# Patient Record
Sex: Female | Born: 1965 | Race: White | Hispanic: No | Marital: Married | Smoking: Former smoker
Health system: Southern US, Community
[De-identification: ages and names within clinical notes are randomized; demographics above are authoritative.]

## PROBLEM LIST (undated history)

## (undated) DIAGNOSIS — Z9889 Other specified postprocedural states: Secondary | ICD-10-CM

## (undated) DIAGNOSIS — E785 Hyperlipidemia, unspecified: Secondary | ICD-10-CM

## (undated) DIAGNOSIS — T8859XA Other complications of anesthesia, initial encounter: Secondary | ICD-10-CM

## (undated) DIAGNOSIS — F419 Anxiety disorder, unspecified: Secondary | ICD-10-CM

## (undated) DIAGNOSIS — R519 Headache, unspecified: Secondary | ICD-10-CM

## (undated) DIAGNOSIS — M1990 Unspecified osteoarthritis, unspecified site: Secondary | ICD-10-CM

## (undated) DIAGNOSIS — R112 Nausea with vomiting, unspecified: Secondary | ICD-10-CM

## (undated) DIAGNOSIS — T4145XA Adverse effect of unspecified anesthetic, initial encounter: Secondary | ICD-10-CM

## (undated) DIAGNOSIS — K219 Gastro-esophageal reflux disease without esophagitis: Secondary | ICD-10-CM

## (undated) DIAGNOSIS — R51 Headache: Secondary | ICD-10-CM

## (undated) HISTORY — PX: APPENDECTOMY: SHX54

## (undated) HISTORY — PX: ABDOMINAL HYSTERECTOMY: SHX81

## (undated) HISTORY — PX: HAMMER TOE SURGERY: SHX385

## (undated) HISTORY — PX: BUNIONECTOMY: SHX129

---

## 2004-08-28 ENCOUNTER — Encounter: Payer: Self-pay | Admitting: Orthopaedic Surgery

## 2005-04-15 ENCOUNTER — Ambulatory Visit: Payer: Self-pay | Admitting: Family Medicine

## 2006-04-17 ENCOUNTER — Ambulatory Visit: Payer: Self-pay | Admitting: Family Medicine

## 2007-02-16 ENCOUNTER — Ambulatory Visit: Payer: Self-pay | Admitting: Emergency Medicine

## 2007-04-23 ENCOUNTER — Ambulatory Visit: Payer: Self-pay | Admitting: Family Medicine

## 2008-02-23 ENCOUNTER — Ambulatory Visit: Payer: Self-pay | Admitting: Urology

## 2008-03-01 ENCOUNTER — Ambulatory Visit: Payer: Self-pay | Admitting: Urology

## 2008-05-09 ENCOUNTER — Ambulatory Visit: Payer: Self-pay | Admitting: Family Medicine

## 2008-11-08 ENCOUNTER — Ambulatory Visit: Payer: Self-pay | Admitting: Internal Medicine

## 2009-05-18 ENCOUNTER — Ambulatory Visit: Payer: Self-pay | Admitting: Family Medicine

## 2009-12-14 ENCOUNTER — Emergency Department: Payer: Self-pay | Admitting: Unknown Physician Specialty

## 2010-01-05 ENCOUNTER — Ambulatory Visit: Payer: Self-pay | Admitting: Family Medicine

## 2010-05-29 ENCOUNTER — Ambulatory Visit: Payer: Self-pay | Admitting: Family Medicine

## 2010-05-30 ENCOUNTER — Ambulatory Visit: Payer: Self-pay | Admitting: Unknown Physician Specialty

## 2010-08-06 ENCOUNTER — Emergency Department: Payer: Self-pay | Admitting: Emergency Medicine

## 2010-10-22 ENCOUNTER — Ambulatory Visit: Payer: Self-pay | Admitting: Obstetrics and Gynecology

## 2010-10-29 ENCOUNTER — Ambulatory Visit: Payer: Self-pay | Admitting: Obstetrics and Gynecology

## 2010-11-01 LAB — PATHOLOGY REPORT

## 2011-06-26 ENCOUNTER — Ambulatory Visit: Payer: Self-pay | Admitting: Obstetrics and Gynecology

## 2012-01-19 ENCOUNTER — Ambulatory Visit: Payer: Self-pay | Admitting: Medical

## 2012-02-24 ENCOUNTER — Emergency Department: Payer: Self-pay | Admitting: Emergency Medicine

## 2012-04-28 ENCOUNTER — Ambulatory Visit: Payer: Self-pay | Admitting: Unknown Physician Specialty

## 2012-08-05 ENCOUNTER — Ambulatory Visit: Payer: Self-pay | Admitting: Podiatry

## 2013-02-11 ENCOUNTER — Ambulatory Visit: Payer: Self-pay | Admitting: Obstetrics and Gynecology

## 2013-12-31 ENCOUNTER — Ambulatory Visit: Payer: Self-pay | Admitting: Physician Assistant

## 2014-02-18 ENCOUNTER — Ambulatory Visit: Payer: Self-pay

## 2014-12-15 ENCOUNTER — Other Ambulatory Visit: Payer: Self-pay | Admitting: Obstetrics and Gynecology

## 2014-12-15 DIAGNOSIS — Z1231 Encounter for screening mammogram for malignant neoplasm of breast: Secondary | ICD-10-CM

## 2014-12-22 ENCOUNTER — Ambulatory Visit
Admission: RE | Admit: 2014-12-22 | Discharge: 2014-12-22 | Disposition: A | Discharge status: Home / Self Care | Payer: 59 | Source: Ambulatory Visit | Attending: Obstetrics and Gynecology | Admitting: Obstetrics and Gynecology

## 2014-12-22 DIAGNOSIS — Z1231 Encounter for screening mammogram for malignant neoplasm of breast: Secondary | ICD-10-CM

## 2015-01-20 ENCOUNTER — Other Ambulatory Visit: Payer: Self-pay | Admitting: Family Medicine

## 2015-01-20 DIAGNOSIS — R1011 Right upper quadrant pain: Secondary | ICD-10-CM

## 2015-01-25 ENCOUNTER — Ambulatory Visit
Admission: RE | Admit: 2015-01-25 | Discharge: 2015-01-25 | Disposition: A | Discharge status: Home / Self Care | Payer: 59 | Source: Ambulatory Visit | Attending: Family Medicine | Admitting: Family Medicine

## 2015-01-25 DIAGNOSIS — R1011 Right upper quadrant pain: Secondary | ICD-10-CM | POA: Insufficient documentation

## 2015-01-26 ENCOUNTER — Ambulatory Visit: Payer: 59

## 2015-01-27 ENCOUNTER — Emergency Department
Admission: EM | Admit: 2015-01-27 | Discharge: 2015-01-27 | Disposition: A | Discharge status: Home / Self Care | Payer: 59 | Attending: Emergency Medicine | Admitting: Emergency Medicine

## 2015-01-27 DIAGNOSIS — R1013 Epigastric pain: Secondary | ICD-10-CM | POA: Diagnosis present

## 2015-01-27 DIAGNOSIS — R197 Diarrhea, unspecified: Secondary | ICD-10-CM | POA: Insufficient documentation

## 2015-01-27 DIAGNOSIS — K219 Gastro-esophageal reflux disease without esophagitis: Secondary | ICD-10-CM | POA: Insufficient documentation

## 2015-01-27 DIAGNOSIS — Z87891 Personal history of nicotine dependence: Secondary | ICD-10-CM | POA: Diagnosis not present

## 2015-01-27 LAB — COMPREHENSIVE METABOLIC PANEL
ALT: 17 U/L (ref 14–54)
AST: 20 U/L (ref 15–41)
Albumin: 4.5 g/dL (ref 3.5–5.0)
Alkaline Phosphatase: 55 U/L (ref 38–126)
Anion gap: 8 (ref 5–15)
BUN: 14 mg/dL (ref 6–20)
CHLORIDE: 105 mmol/L (ref 101–111)
CO2: 27 mmol/L (ref 22–32)
CREATININE: 0.82 mg/dL (ref 0.44–1.00)
Calcium: 9.6 mg/dL (ref 8.9–10.3)
Glucose, Bld: 100 mg/dL — ABNORMAL HIGH (ref 65–99)
POTASSIUM: 4.3 mmol/L (ref 3.5–5.1)
SODIUM: 140 mmol/L (ref 135–145)
Total Bilirubin: 0.3 mg/dL (ref 0.3–1.2)
Total Protein: 7.7 g/dL (ref 6.5–8.1)

## 2015-01-27 LAB — URINALYSIS COMPLETE WITH MICROSCOPIC (ARMC ONLY)
Bilirubin Urine: NEGATIVE
Glucose, UA: NEGATIVE mg/dL
HGB URINE DIPSTICK: NEGATIVE
Ketones, ur: NEGATIVE mg/dL
LEUKOCYTES UA: NEGATIVE
Nitrite: NEGATIVE
PH: 5 (ref 5.0–8.0)
PROTEIN: NEGATIVE mg/dL
Specific Gravity, Urine: 1.018 (ref 1.005–1.030)

## 2015-01-27 LAB — CBC
HEMATOCRIT: 45 % (ref 35.0–47.0)
HEMOGLOBIN: 14.9 g/dL (ref 12.0–16.0)
MCH: 30.2 pg (ref 26.0–34.0)
MCHC: 33.2 g/dL (ref 32.0–36.0)
MCV: 90.8 fL (ref 80.0–100.0)
PLATELETS: 178 10*3/uL (ref 150–440)
RBC: 4.95 MIL/uL (ref 3.80–5.20)
RDW: 14 % (ref 11.5–14.5)
WBC: 5 10*3/uL (ref 3.6–11.0)

## 2015-01-27 LAB — LIPASE, BLOOD: LIPASE: 38 U/L (ref 11–51)

## 2015-01-27 MED ORDER — GI COCKTAIL ~~LOC~~
30.0000 mL | Freq: Once | ORAL | Status: AC
  Administered 2015-01-27: 30 mL via ORAL
  Filled 2015-01-27: qty 30

## 2015-01-27 MED ORDER — OXYCODONE-ACETAMINOPHEN 5-325 MG PO TABS: 2.0000 | ORAL_TABLET | Freq: Four times a day (QID) | ORAL | Status: DC | PRN

## 2015-01-27 MED ORDER — FAMOTIDINE 40 MG PO TABS: 40.0000 mg | ORAL_TABLET | Freq: Every evening | ORAL | Status: DC

## 2015-01-27 MED ORDER — SUCRALFATE 1 G PO TABS
1.0000 g | ORAL_TABLET | Freq: Once | ORAL | Status: AC
  Administered 2015-01-27: 1 g via ORAL
  Filled 2015-01-27 (×2): qty 1

## 2015-01-27 MED ORDER — FAMOTIDINE 20 MG PO TABS
20.0000 mg | ORAL_TABLET | Freq: Once | ORAL | Status: AC
  Administered 2015-01-27: 20 mg via ORAL
  Filled 2015-01-27: qty 1

## 2015-01-27 MED ORDER — SUCRALFATE 1 G PO TABS: 1.0000 g | ORAL_TABLET | Freq: Four times a day (QID) | ORAL | Status: DC

## 2015-01-27 NOTE — ED Notes (Signed)
Pt c/o upper abd pain with diarrhea for the past 2 weeks, states she has seen her PCP and had labs, stool and ultrasound to r/o gall bladder that has all came back negative..states she just feels bloated and swollen in the abd with continued pain and diarrhea

## 2015-01-27 NOTE — ED Provider Notes (Signed)
[facility] Emergency Department Provider Note     Time seen: ----------------------------------------- 3:08 PM on 01/27/2015 -----------------------------------------    I have reviewed the triage vital signs and the nursing notes.   HISTORY  Chief Complaint Abdominal Pain    HPI Alexa Garcia is a 49 y.o. female who presents ER for abdominal pain with diarrhea for the last 2 weeks. Pain is in the epigastric area, she seen her primary care doctor and had labs, stool and ultrasound done. Ultrasound of gallbladder was normal, she feels bloated and swollen continues to have pain and diarrhea. She is not taking omeprazole as prescribed, does note that she is under fair amount of stress due to her son medical problems.   History reviewed. No pertinent past medical history.  There are no active problems to display for this patient.   Past Surgical History  Procedure Laterality Date  . Abdominal hysterectomy    . Bunionectomy      Allergies Review of patient's allergies indicates no known allergies.  Social History Social History  Substance Use Topics  . Smoking status: Former Research scientist (life sciences)  . Smokeless tobacco: None  . Alcohol Use: No    Review of Systems Constitutional: Negative for fever. Eyes: Negative for visual changes. ENT: Negative for sore throat. Cardiovascular: Negative for chest pain. Respiratory: Negative for shortness of breath. Gastrointestinal: Positive for abdominal pain and diarrhea Genitourinary: Negative for dysuria. Musculoskeletal: Negative for back pain. Skin: Negative for rash. Neurological: Negative for headaches, focal weakness or numbness.  10-point ROS otherwise negative.  ____________________________________________   PHYSICAL EXAM:  VITAL SIGNS: ED Triage Vitals  Enc Vitals Group     BP 01/27/15 1345 135/91 mmHg     Pulse Rate 01/27/15 1345 80     Resp 01/27/15 1345 18     Temp 01/27/15 1345 97.5 F  (36.4 C)     Temp Source 01/27/15 1345 Oral     SpO2 01/27/15 1345 97 %     Weight 01/27/15 1345 187 lb (84.823 kg)     Height 01/27/15 1345 5\' 4"  (1.626 m)     Head Cir --      Peak Flow --      Pain Score 01/27/15 1346 9     Pain Loc --      Pain Edu? --      Excl. in [place]? --     Constitutional: Alert and oriented. Well appearing and in no distress. Eyes: Conjunctivae are normal. PERRL. Normal extraocular movements. ENT   Head: Normocephalic and atraumatic.   Nose: No congestion/rhinnorhea.   Mouth/Throat: Mucous membranes are moist.   Neck: No stridor. Cardiovascular: Normal rate, regular rhythm. Normal and symmetric distal pulses are present in all extremities. No murmurs, rubs, or gallops. Respiratory: Normal respiratory effort without tachypnea nor retractions. Breath sounds are clear and equal bilaterally. No wheezes/rales/rhonchi. Gastrointestinal: Epigastric tenderness, no rebound or guarding. Normal bowel sounds. Musculoskeletal: Nontender with normal range of motion in all extremities. No joint effusions.  No lower extremity tenderness nor edema. Neurologic:  Normal speech and language. No gross focal neurologic deficits are appreciated. Speech is normal. No gait instability. Skin:  Skin is warm, dry and intact. No rash noted. Psychiatric: Mood and affect are normal. Speech and behavior are normal. Patient exhibits appropriate insight and judgment. ____________________________________________  ED COURSE:  Pertinent labs & imaging results that were available during my care of the patient were reviewed by me and considered in my medical decision making (see  chart for details). She is in no acute distress, will obtain basic labs and reevaluate. Patient will likely need GI cocktail and antacids ____________________________________________    LABS (pertinent positives/negatives)  Labs Reviewed  COMPREHENSIVE METABOLIC PANEL - Abnormal; Notable for the  following:    Glucose, Bld 100 (*)    All other components within normal limits  URINALYSIS COMPLETEWITH MICROSCOPIC ([facility] ONLY) - Abnormal; Notable for the following:    Color, Urine YELLOW (*)    APPearance HAZY (*)    Bacteria, UA RARE (*)    Squamous Epithelial / LPF 6-30 (*)    All other components within normal limits  LIPASE, BLOOD  CBC   ____________________________________________  FINAL ASSESSMENT AND PLAN  GERD  Plan: Patient with labs and imaging as dictated above. Patient likely with either GERD or peptic ulcer disease. She'll be discharged with Pepcid, omeprazole, Carafate and referred to GI for follow-up.   Earleen Newport, MD   Earleen Newport, MD 01/27/15 [phone]

## 2015-01-27 NOTE — Discharge Instructions (Signed)
Gastroesophageal Reflux Disease, Adult Normally, food travels down the esophagus and stays in the stomach to be digested. However, when a person has gastroesophageal reflux disease (GERD), food and stomach acid move back up into the esophagus. When this happens, the esophagus becomes sore and inflamed. Over time, GERD can create small holes (ulcers) in the lining of the esophagus.  CAUSES This condition is caused by a problem with the muscle between the esophagus and the stomach (lower esophageal sphincter, or LES). Normally, the LES muscle closes after food passes through the esophagus to the stomach. When the LES is weakened or abnormal, it does not close properly, and that allows food and stomach acid to go back up into the esophagus. The LES can be weakened by certain dietary substances, medicines, and medical conditions, including:  Tobacco use.  Pregnancy.  Having a hiatal hernia.  Heavy alcohol use.  Certain foods and beverages, such as coffee, chocolate, onions, and peppermint. RISK FACTORS This condition is more likely to develop in:  People who have an increased body weight.  People who have connective tissue disorders.  People who use NSAID medicines. SYMPTOMS Symptoms of this condition include:  Heartburn.  Difficult or painful swallowing.  The feeling of having a lump in the throat.  Abitter taste in the mouth.  Bad breath.  Having a large amount of saliva.  Having an upset or bloated stomach.  Belching.  Chest pain.  Shortness of breath or wheezing.  Ongoing (chronic) cough or a night-time cough.  Wearing away of tooth enamel.  Weight loss. Different conditions can cause chest pain. Make sure to see your health care provider if you experience chest pain. DIAGNOSIS Your health care provider will take a medical history and perform a physical exam. To determine if you have mild or severe GERD, your health care provider may also monitor how you respond  to treatment. You may also have other tests, including:  An endoscopy toexamine your stomach and esophagus with a small camera.  A test thatmeasures the acidity level in your esophagus.  A test thatmeasures how much pressure is on your esophagus.  A barium swallow or modified barium swallow to show the shape, size, and functioning of your esophagus. TREATMENT The goal of treatment is to help relieve your symptoms and to prevent complications. Treatment for this condition may vary depending on how severe your symptoms are. Your health care provider may recommend:  Changes to your diet.  Medicine.  Surgery. HOME CARE INSTRUCTIONS Diet  Follow a diet as recommended by your health care provider. This may involve avoiding foods and drinks such as:  Coffee and tea (with or without caffeine).  Drinks that containalcohol.  Energy drinks and sports drinks.  Carbonated drinks or sodas.  Chocolate and cocoa.  Peppermint and mint flavorings.  Garlic and onions.  Horseradish.  Spicy and acidic foods, including peppers, chili powder, curry powder, vinegar, hot sauces, and barbecue sauce.  Citrus fruit juices and citrus fruits, such as oranges, lemons, and limes.  Tomato-based foods, such as red sauce, chili, salsa, and pizza with red sauce.  Fried and fatty foods, such as donuts, french fries, potato chips, and high-fat dressings.  High-fat meats, such as hot dogs and fatty cuts of red and white meats, such as rib eye steak, sausage, ham, and bacon.  High-fat dairy items, such as whole milk, butter, and cream cheese.  Eat small, frequent meals instead of large meals.  Avoid drinking large amounts of liquid with your  meals.  Avoid eating meals during the 2-3 hours before bedtime.  Avoid lying down right after you eat.  Do not exercise right after you eat. General Instructions  Pay attention to any changes in your symptoms.  Take over-the-counter and prescription  medicines only as told by your health care provider. Do not take aspirin, ibuprofen, or other NSAIDs unless your health care provider told you to do so.  Do not use any tobacco products, including cigarettes, chewing tobacco, and e-cigarettes. If you need help quitting, ask your health care provider.  Wear loose-fitting clothing. Do not wear anything tight around your waist that causes pressure on your abdomen.  Raise (elevate) the head of your bed 6 inches (15cm).  Try to reduce your stress, such as with yoga or meditation. If you need help reducing stress, ask your health care provider.  If you are overweight, reduce your weight to an amount that is healthy for you. Ask your health care provider for guidance about a safe weight loss goal.  Keep all follow-up visits as told by your health care provider. This is important. SEEK MEDICAL CARE IF:  You have new symptoms.  You have unexplained weight loss.  You have difficulty swallowing, or it hurts to swallow.  You have wheezing or a persistent cough.  Your symptoms do not improve with treatment.  You have a hoarse voice. SEEK IMMEDIATE MEDICAL CARE IF:  You have pain in your arms, neck, jaw, teeth, or back.  You feel sweaty, dizzy, or light-headed.  You have chest pain or shortness of breath.  You vomit and your vomit looks like blood or coffee grounds.  You faint.  Your stool is bloody or black.  You cannot swallow, drink, or eat.   This information is not intended to replace advice given to you by your health care provider. Make sure you discuss any questions you have with your health care provider.   Document Released: 12/05/2004 Document Revised: 11/16/2014 Document Reviewed: 06/22/2014 Elsevier Interactive Patient Education 2016 Elsevier Inc. Peptic Ulcer A peptic ulcer is a sore in the lining of your esophagus (esophageal ulcer), stomach (gastric ulcer), or in the first part of your small intestine (duodenal  ulcer). The ulcer causes erosion into the deeper tissue. CAUSES  Normally, the lining of the stomach and the small intestine protects itself from the acid that digests food. The protective lining can be damaged by:  An infection caused by a bacterium called Helicobacter pylori (H. pylori).  Regular use of nonsteroidal anti-inflammatory drugs (NSAIDs), such as ibuprofen or aspirin.  Smoking tobacco. Other risk factors include being older than 25, drinking alcohol excessively, and having a family history of ulcer disease.  SYMPTOMS   Burning pain or gnawing in the area between the chest and the belly button.  Heartburn.  Nausea and vomiting.  Bloating. The pain can be worse on an empty stomach and at night. If the ulcer results in bleeding, it can cause:  Black, tarry stools.  Vomiting of bright red blood.  Vomiting of coffee-ground-looking materials. DIAGNOSIS  A diagnosis is usually made based upon your history and an exam. Other tests and procedures may be performed to find the cause of the ulcer. Finding a cause will help determine the best treatment. Tests and procedures may include:  Blood tests, stool tests, or breath tests to check for the bacterium H. pylori.  An upper gastrointestinal (GI) series of the esophagus, stomach, and small intestine.  An endoscopy to examine the esophagus,  stomach, and small intestine.  A biopsy. TREATMENT  Treatment may include:  Eliminating the cause of the ulcer, such as smoking, NSAIDs, or alcohol.  Medicines to reduce the amount of acid in your digestive tract.  Antibiotic medicines if the ulcer is caused by the H. pylori bacterium.  An upper endoscopy to treat a bleeding ulcer.  Surgery if the bleeding is severe or if the ulcer created a hole somewhere in the digestive system. HOME CARE INSTRUCTIONS   Avoid tobacco, alcohol, and caffeine. Smoking can increase the acid in the stomach, and continued smoking will impair the  healing of ulcers.  Avoid foods and drinks that seem to cause discomfort or aggravate your ulcer.  Only take medicines as directed by your caregiver. Do not substitute over-the-counter medicines for prescription medicines without talking to your caregiver.  Keep any follow-up appointments and tests as directed. SEEK MEDICAL CARE IF:   Your do not improve within 7 days of starting treatment.  You have ongoing indigestion or heartburn. SEEK IMMEDIATE MEDICAL CARE IF:   You have sudden, sharp, or persistent abdominal pain.  You have bloody or dark black, tarry stools.  You vomit blood or vomit that looks like coffee grounds.  You become light-headed, weak, or feel faint.  You become sweaty or clammy. MAKE SURE YOU:   Understand these instructions.  Will watch your condition.  Will get help right away if you are not doing well or get worse.   This information is not intended to replace advice given to you by your health care provider. Make sure you discuss any questions you have with your health care provider.   Document Released: 02/23/2000 Document Revised: 03/18/2014 Document Reviewed: 09/25/2011 Elsevier Interactive Patient Education [employer].

## 2015-02-13 ENCOUNTER — Encounter: Payer: Self-pay | Admitting: *Deleted

## 2015-02-14 ENCOUNTER — Encounter
Admission: RE | Disposition: A | Discharge status: Home / Self Care | Payer: Self-pay | Source: Ambulatory Visit | Attending: Gastroenterology

## 2015-02-14 ENCOUNTER — Encounter: Payer: Self-pay | Admitting: Anesthesiology

## 2015-02-14 ENCOUNTER — Ambulatory Visit: Payer: 59 | Admitting: Anesthesiology

## 2015-02-14 ENCOUNTER — Ambulatory Visit
Admission: RE | Admit: 2015-02-14 | Discharge: 2015-02-14 | Disposition: A | Discharge status: Home / Self Care | Payer: 59 | Source: Ambulatory Visit | Attending: Gastroenterology | Admitting: Gastroenterology

## 2015-02-14 DIAGNOSIS — Z90710 Acquired absence of both cervix and uterus: Secondary | ICD-10-CM | POA: Insufficient documentation

## 2015-02-14 DIAGNOSIS — Z8249 Family history of ischemic heart disease and other diseases of the circulatory system: Secondary | ICD-10-CM | POA: Insufficient documentation

## 2015-02-14 DIAGNOSIS — Z823 Family history of stroke: Secondary | ICD-10-CM | POA: Diagnosis not present

## 2015-02-14 DIAGNOSIS — E785 Hyperlipidemia, unspecified: Secondary | ICD-10-CM | POA: Diagnosis not present

## 2015-02-14 DIAGNOSIS — Z87891 Personal history of nicotine dependence: Secondary | ICD-10-CM | POA: Diagnosis not present

## 2015-02-14 DIAGNOSIS — Z1211 Encounter for screening for malignant neoplasm of colon: Secondary | ICD-10-CM | POA: Diagnosis not present

## 2015-02-14 DIAGNOSIS — G43909 Migraine, unspecified, not intractable, without status migrainosus: Secondary | ICD-10-CM | POA: Insufficient documentation

## 2015-02-14 DIAGNOSIS — R51 Headache: Secondary | ICD-10-CM | POA: Diagnosis not present

## 2015-02-14 DIAGNOSIS — K219 Gastro-esophageal reflux disease without esophagitis: Secondary | ICD-10-CM | POA: Diagnosis not present

## 2015-02-14 DIAGNOSIS — Z809 Family history of malignant neoplasm, unspecified: Secondary | ICD-10-CM | POA: Diagnosis not present

## 2015-02-14 DIAGNOSIS — R1013 Epigastric pain: Secondary | ICD-10-CM | POA: Insufficient documentation

## 2015-02-14 DIAGNOSIS — D123 Benign neoplasm of transverse colon: Secondary | ICD-10-CM | POA: Diagnosis not present

## 2015-02-14 DIAGNOSIS — Z79899 Other long term (current) drug therapy: Secondary | ICD-10-CM | POA: Insufficient documentation

## 2015-02-14 DIAGNOSIS — Z833 Family history of diabetes mellitus: Secondary | ICD-10-CM | POA: Insufficient documentation

## 2015-02-14 DIAGNOSIS — K5730 Diverticulosis of large intestine without perforation or abscess without bleeding: Secondary | ICD-10-CM | POA: Diagnosis not present

## 2015-02-14 HISTORY — DX: Headache, unspecified: R51.9

## 2015-02-14 HISTORY — DX: Hyperlipidemia, unspecified: E78.5

## 2015-02-14 HISTORY — DX: Headache: R51

## 2015-02-14 HISTORY — PX: COLONOSCOPY: SHX5424

## 2015-02-14 HISTORY — DX: Gastro-esophageal reflux disease without esophagitis: K21.9

## 2015-02-14 SURGERY — COLONOSCOPY
Anesthesia: General

## 2015-02-14 MED ORDER — GLYCOPYRROLATE 0.2 MG/ML IJ SOLN
INTRAMUSCULAR | Status: DC | PRN
  Administered 2015-02-14 (×2): 0.2 mg via INTRAVENOUS

## 2015-02-14 MED ORDER — SODIUM CHLORIDE 0.9 % IV SOLN
INTRAVENOUS | Status: DC
  Administered 2015-02-14: 08:00:00 via INTRAVENOUS

## 2015-02-14 MED ORDER — MIDAZOLAM HCL 5 MG/5ML IJ SOLN
INTRAMUSCULAR | Status: DC | PRN
  Administered 2015-02-14 (×2): 1 mg via INTRAVENOUS

## 2015-02-14 MED ORDER — PROPOFOL 10 MG/ML IV BOLUS
INTRAVENOUS | Status: DC | PRN
  Administered 2015-02-14: 50 mg via INTRAVENOUS

## 2015-02-14 MED ORDER — FENTANYL CITRATE (PF) 100 MCG/2ML IJ SOLN
INTRAMUSCULAR | Status: DC | PRN
  Administered 2015-02-14 (×2): 50 ug via INTRAVENOUS

## 2015-02-14 MED ORDER — PROPOFOL 500 MG/50ML IV EMUL
INTRAVENOUS | Status: DC | PRN
  Administered 2015-02-14: 100 ug/kg/min via INTRAVENOUS

## 2015-02-14 MED ORDER — SODIUM CHLORIDE 0.9 % IV SOLN: INTRAVENOUS | Status: DC

## 2015-02-14 MED ORDER — LIDOCAINE HCL (PF) 2 % IJ SOLN
INTRAMUSCULAR | Status: DC | PRN
Start: 2015-02-14 — End: 2015-02-14
  Administered 2015-02-14: 50 mg

## 2015-02-14 NOTE — Transfer of Care (Signed)
Immediate Anesthesia Transfer of Care Note  Patient: Alexa Garcia  Procedure(s) Performed: Procedure(s): COLONOSCOPY (N/A)  Patient Location: PACU  Anesthesia Type:General  Level of Consciousness: sedated  Airway & Oxygen Therapy: Patient Spontanous Breathing and Patient connected to nasal cannula oxygen  Post-op Assessment: Report given to RN and Post -op Vital signs reviewed and stable  Post vital signs: Reviewed and stable  Last Vitals:  Filed Vitals:   02/14/15 0717  BP: 115/79  Pulse: 71  Temp: 36.4 C  Resp: 18    Complications: No apparent anesthesia complications

## 2015-02-14 NOTE — H&P (Signed)
Outpatient short stay form Pre-procedure 02/14/2015 8:09 AM Lollie Sails MD  Primary Physician: Dr. Laverta Baltimore  Reason for visit:  Screening colonoscopy  History of present illness:  Patient is a 49 year old female presenting today for colonoscopy. She's never had a colonoscopy before. She was seen by Mrs. London in the clinic she did have some dyspepsia or epigastric discomfort and was advised to start some omeprazole. She wants to hold off on doing an EGD at this point. Told to avoid NSAIDs and follow low residue/bland diet.  She tolerated her prep well. She takes no aspirin products or blood thinners.    Current facility-administered medications:  .  0.9 %  sodium chloride infusion, , Intravenous, Continuous, Lollie Sails, MD, Last Rate: 20 mL/hr at 02/14/15 [id number]  Prescriptions prior to admission  Medication Sig Dispense Refill Last Dose  . fluticasone (FLONASE) 50 MCG/ACT nasal spray Place into both nostrils daily.     Marland Kitchen ibuprofen (ADVIL,MOTRIN) 200 MG tablet Take 200 mg by mouth every 6 (six) hours as needed.     . Pediatric Multivit-Minerals-C (CHEWABLES MULTIVITAMIN PO) Take by mouth.   Past Week at Unknown time  . famotidine (PEPCID) 40 MG tablet Take 1 tablet (40 mg total) by mouth every evening. 30 tablet 1   . oxyCODONE-acetaminophen (PERCOCET) 5-325 MG tablet Take 2 tablets by mouth every 6 (six) hours as needed for moderate pain or severe pain. 20 tablet 0   . sucralfate (CARAFATE) 1 G tablet Take 1 tablet (1 g total) by mouth 4 (four) times daily. (Patient not taking: Reported on 02/14/2015) 120 tablet 1 Not Taking at Unknown time     No Known Allergies   Past Medical History  Diagnosis Date  . GERD (gastroesophageal reflux disease)   . Hyperlipidemia   . Headache     migranes    Review of systems:      Physical Exam    Heart and lungs: Regular rate and rhythm without rub or gallop lungs are bilaterally clear    HEENT: Normocephalic  atraumatic eyes are anicteric   Other:     Pertinant exam for procedure: Soft nontender nondistended bowel sounds positive normoactive    Planned proceedures: Colonoscopy and indicated procedures. I have discussed the risks benefits and complications of procedures to include not limited to bleeding, infection, perforation and the risk of sedation and the patient wishes to proceed.    Lollie Sails, MD Gastroenterology 02/14/2015  8:09 AM

## 2015-02-14 NOTE — Op Note (Signed)
[facility]astroenterology Patient Name: Alexa Garcia Procedure Date: 02/14/2015 8:09 AM MRN: [id number] Account #: [id number] Date of Birth: 1966-01-12 Admit Type: Outpatient Age: 49 Room: [facility] ENDO ROOM 3 Gender: Female Note Status: Finalized Procedure:         Colonoscopy Indications:       Screening for colorectal malignant neoplasm Providers:         Lollie Sails, MD Referring MD:      Shirline Frees (Referring MD) Medicines:         Monitored Anesthesia Care Complications:     No immediate complications. Procedure:         Pre-Anesthesia Assessment:                    - ASA Grade Assessment: II - A patient with mild systemic                     disease.                    After obtaining informed consent, the colonoscope was                     passed under direct vision. Throughout the procedure, the                     patient's blood pressure, pulse, and oxygen saturations                     were monitored continuously. The Colonoscope was                     introduced through the anus and advanced to the the cecum,                     identified by appendiceal orifice and ileocecal valve. The                     colonoscopy was performed without difficulty. The patient                     tolerated the procedure well. The quality of the bowel                     preparation was good. Findings:      A 1 mm polyp was found in the transverse colon. The polyp was flat. The       polyp was removed with a cold biopsy forceps. Resection and retrieval       were complete.      Multiple small-mouthed diverticula were found in the sigmoid colon and       in the descending colon.      The retroflexed view of the distal rectum and anal verge was normal and       showed no anal or rectal abnormalities.      The exam was otherwise without abnormality.      The digital rectal exam was normal. Impression:        - One 1 mm polyp in the transverse  colon. Resected and                     retrieved.                    - Diverticulosis in the sigmoid colon  and in the                     descending colon.                    - The distal rectum and anal verge are normal on                     retroflexion view.                    - The examination was otherwise normal. Recommendation:    - Discharge patient to home. Procedure Code(s): --- Professional ---                    [phone], Colonoscopy, flexible; with biopsy, single or                     multiple Diagnosis Code(s): --- Professional ---                    Z12.11, Encounter for screening for malignant neoplasm of                     colon                    D12.3, Benign neoplasm of transverse colon                    K57.30, Diverticulosis of large intestine without                     perforation or abscess without bleeding CPT copyright 2014 American Medical Association. All rights reserved. The codes documented in this report are preliminary and upon coder review may  be revised to meet current compliance requirements. Lollie Sails, MD 02/14/2015 8:48:34 AM This report has been signed electronically. Number of Addenda: 0 Note Initiated On: 02/14/2015 8:09 AM Scope Withdrawal Time: 0 hours 11 minutes 0 seconds  Total Procedure Duration: 0 hours 28 minutes 26 seconds       [facility]

## 2015-02-14 NOTE — Anesthesia Preprocedure Evaluation (Signed)
Anesthesia Evaluation  Patient identified by MRN, date of birth, ID band Patient awake    Reviewed: Allergy & Precautions, H&P , NPO status , Patient's Chart, lab work & pertinent test results  History of Anesthesia Complications Negative for: history of anesthetic complications  Airway Mallampati: II  TM Distance: >3 FB Neck ROM: full    Dental no notable dental hx. (+) Teeth Intact   Pulmonary neg shortness of breath, former smoker,    Pulmonary exam normal breath sounds clear to auscultation       Cardiovascular Exercise Tolerance: Good (-) angina(-) Past MI and (-) DOE negative cardio ROS Normal cardiovascular exam Rhythm:regular Rate:Normal     Neuro/Psych  Headaches, negative psych ROS   GI/Hepatic Neg liver ROS, GERD  Controlled,  Endo/Other  negative endocrine ROS  Renal/GU negative Renal ROS  negative genitourinary   Musculoskeletal   Abdominal   Peds  Hematology negative hematology ROS (+)   Anesthesia Other Findings Past Medical History:   GERD (gastroesophageal reflux disease)                       Hyperlipidemia                                               Headache                                                       Comment:migranes  Past Surgical History:   ABDOMINAL HYSTERECTOMY                                        BUNIONECTOMY                                                  APPENDECTOMY                                                  HAMMER TOE SURGERY                                           Signs and symptoms suggestive of sleep apnea     Reproductive/Obstetrics negative OB ROS                             Anesthesia Physical Anesthesia Plan  ASA: III  Anesthesia Plan: General   Post-op Pain Management:    Induction:   Airway Management Planned:   Additional Equipment:   Intra-op Plan:   Post-operative Plan:   Informed Consent: I have  reviewed the patients History and Physical, chart, labs and discussed the procedure including the risks, benefits and alternatives for the proposed  anesthesia with the patient or authorized representative who has indicated his/her understanding and acceptance.   Dental Advisory Given  Plan Discussed with: Anesthesiologist, CRNA and Surgeon  Anesthesia Plan Comments:         Anesthesia Quick Evaluation

## 2015-02-14 NOTE — Anesthesia Postprocedure Evaluation (Signed)
Anesthesia Post Note  Patient: Alexa Garcia  Procedure(s) Performed: Procedure(s) (LRB): COLONOSCOPY (N/A)  Patient location during evaluation: PACU Anesthesia Type: General Level of consciousness: awake and alert Pain management: pain level controlled Vital Signs Assessment: post-procedure vital signs reviewed and stable Respiratory status: spontaneous breathing, nonlabored ventilation, respiratory function stable and patient connected to nasal cannula oxygen Cardiovascular status: blood pressure returned to baseline and stable Postop Assessment: no signs of nausea or vomiting Anesthetic complications: no    Last Vitals:  Filed Vitals:   02/14/15 0910 02/14/15 0920  BP: 102/84 113/90  Pulse: 95 95  Temp:    Resp: 12 17    Last Pain: There were no vitals filed for this visit.               Molli Barrows

## 2015-02-15 ENCOUNTER — Encounter: Payer: Self-pay | Admitting: Gastroenterology

## 2015-02-15 LAB — SURGICAL PATHOLOGY

## 2015-03-08 ENCOUNTER — Encounter: Payer: Self-pay | Admitting: Emergency Medicine

## 2015-03-08 ENCOUNTER — Ambulatory Visit
Admission: EM | Admit: 2015-03-08 | Discharge: 2015-03-08 | Disposition: A | Discharge status: Home / Self Care | Payer: 59 | Attending: Family Medicine | Admitting: Family Medicine

## 2015-03-08 DIAGNOSIS — R0981 Nasal congestion: Secondary | ICD-10-CM

## 2015-03-08 NOTE — ED Provider Notes (Signed)
She presents today with symptoms of nasal congestion for the past two days. She has had postnasal drip as well. She denies any colored mucus. She denies any fever, chest pain, shortness of breath, severe headache. She has been taking Flonase for 1 day.  She denies any new possible allergens or new medications .   ROS: Negative except mentioned above.  Vitals as per Epic. GENERAL: NAD HEENT: mild pharyngeal erythema, no exudate, no erythema of TMs, mild to moderate erythema of nasal mucosa, no cervical LAD RESP: CTA B CARD: RRR NEURO: CN II-XII grossly intact   A/P: Nasal Congestion-patient given instructions to take antihistamine, decongestant, continue her Flonase. If symptoms persist or worsen as discussed patient may need an antibiotic. I have asked that she seek medical attention if symptoms do persist or worsen.   Paulina Fusi, MD 03/08/15 [phone]

## 2015-03-08 NOTE — ED Notes (Signed)
Pt reports increasing nasal congestion for past few days, facial pain. Took flonase. PMH allergies.

## 2016-03-28 ENCOUNTER — Emergency Department
Admission: EM | Admit: 2016-03-28 | Discharge: 2016-03-28 | Disposition: A | Discharge status: Home / Self Care | Payer: 59 | Attending: Emergency Medicine | Admitting: Emergency Medicine

## 2016-03-28 ENCOUNTER — Emergency Department: Payer: 59

## 2016-03-28 DIAGNOSIS — Y990 Civilian activity done for income or pay: Secondary | ICD-10-CM | POA: Insufficient documentation

## 2016-03-28 DIAGNOSIS — Y9389 Activity, other specified: Secondary | ICD-10-CM | POA: Insufficient documentation

## 2016-03-28 DIAGNOSIS — Y92481 Parking lot as the place of occurrence of the external cause: Secondary | ICD-10-CM | POA: Diagnosis not present

## 2016-03-28 DIAGNOSIS — W010XXA Fall on same level from slipping, tripping and stumbling without subsequent striking against object, initial encounter: Secondary | ICD-10-CM | POA: Diagnosis not present

## 2016-03-28 DIAGNOSIS — S52592A Other fractures of lower end of left radius, initial encounter for closed fracture: Secondary | ICD-10-CM | POA: Diagnosis not present

## 2016-03-28 DIAGNOSIS — W19XXXA Unspecified fall, initial encounter: Secondary | ICD-10-CM | POA: Diagnosis not present

## 2016-03-28 DIAGNOSIS — S52352A Displaced comminuted fracture of shaft of radius, left arm, initial encounter for closed fracture: Secondary | ICD-10-CM | POA: Diagnosis not present

## 2016-03-28 DIAGNOSIS — S52502A Unspecified fracture of the lower end of left radius, initial encounter for closed fracture: Secondary | ICD-10-CM

## 2016-03-28 DIAGNOSIS — Z87891 Personal history of nicotine dependence: Secondary | ICD-10-CM | POA: Insufficient documentation

## 2016-03-28 DIAGNOSIS — M79602 Pain in left arm: Secondary | ICD-10-CM | POA: Diagnosis not present

## 2016-03-28 DIAGNOSIS — S6992XA Unspecified injury of left wrist, hand and finger(s), initial encounter: Secondary | ICD-10-CM | POA: Diagnosis not present

## 2016-03-28 LAB — CBC WITH DIFFERENTIAL/PLATELET
BASOS PCT: 1 %
Basophils Absolute: 0.1 10*3/uL (ref 0–0.1)
EOS ABS: 0.2 10*3/uL (ref 0–0.7)
Eosinophils Relative: 3 %
HCT: 46.1 % (ref 35.0–47.0)
Hemoglobin: 15.6 g/dL (ref 12.0–16.0)
Lymphocytes Relative: 13 %
Lymphs Abs: 1 10*3/uL (ref 1.0–3.6)
MCH: 30 pg (ref 26.0–34.0)
MCHC: 33.9 g/dL (ref 32.0–36.0)
MCV: 88.4 fL (ref 80.0–100.0)
MONOS PCT: 8 %
Monocytes Absolute: 0.6 10*3/uL (ref 0.2–0.9)
NEUTROS PCT: 75 %
Neutro Abs: 5.6 10*3/uL (ref 1.4–6.5)
Platelets: 183 10*3/uL (ref 150–440)
RBC: 5.21 MIL/uL — ABNORMAL HIGH (ref 3.80–5.20)
RDW: 14.3 % (ref 11.5–14.5)
WBC: 7.5 10*3/uL (ref 3.6–11.0)

## 2016-03-28 LAB — BASIC METABOLIC PANEL
Anion gap: 7 (ref 5–15)
BUN: 22 mg/dL — ABNORMAL HIGH (ref 6–20)
CALCIUM: 9.9 mg/dL (ref 8.9–10.3)
CO2: 26 mmol/L (ref 22–32)
CREATININE: 0.89 mg/dL (ref 0.44–1.00)
Chloride: 106 mmol/L (ref 101–111)
GFR calc non Af Amer: >60 mL/min (ref >60)
Glucose, Bld: 119 mg/dL — ABNORMAL HIGH (ref 65–99)
Potassium: 4.9 mmol/L (ref 3.5–5.1)
SODIUM: 139 mmol/L (ref 135–145)

## 2016-03-28 MED ORDER — FENTANYL CITRATE (PF) 100 MCG/2ML IJ SOLN
50.0000 ug | Freq: Once | INTRAMUSCULAR | Status: AC
  Administered 2016-03-28: 50 ug via INTRAVENOUS
  Filled 2016-03-28: qty 2

## 2016-03-28 MED ORDER — HYDROCODONE-ACETAMINOPHEN 5-325 MG PO TABS: 1.0000 | ORAL_TABLET | Freq: Four times a day (QID) | ORAL | 0 refills | Status: DC | PRN

## 2016-03-28 NOTE — ED Notes (Signed)
Pt states she fell on ice this AM and is having left wrist pain, swelling noted, cap refill <3 secs, able to move fingers

## 2016-03-28 NOTE — ED Triage Notes (Signed)
Pt arrived via EMS from [facility]. Pt fell in parking lot reports left arm pain. No obvious deformities.

## 2016-03-28 NOTE — ED Provider Notes (Signed)
[facility] Emergency Department Provider Note  ____________________________________________  Time seen: Approximately 12:49 PM  I have reviewed the triage vital signs and the nursing notes.   HISTORY  Chief Complaint Fall and Arm Pain    HPI Alexa Garcia is a 51 y.o. female , NAD, presents to the emergency department via EMS for evaluation of left arm pain. Patient states she slipped and fell in the snow and ice while trying to pick up her vehicle at work. Place her left arm out to catch her fall. Had immediate pain about the distal left forearm and has noted swelling since that time. Has decreased range of motion of the left wrist, forearm due to pain. Denies any open wounds or lacerations. Denies numbness, weakness or tingling. States she did not hit her head and has had no neck or back pain since the incident.   Past Medical History:  Diagnosis Date  . GERD (gastroesophageal reflux disease)   . Headache    migranes  . Hyperlipidemia     There are no active problems to display for this patient.   Past Surgical History:  Procedure Laterality Date  . ABDOMINAL HYSTERECTOMY    . APPENDECTOMY    . BUNIONECTOMY    . COLONOSCOPY N/A 02/14/2015   Procedure: COLONOSCOPY;  Surgeon: Lollie Sails, MD;  [facility]: [facility] ENDOSCOPY;  Service: Endoscopy;  Laterality: N/A;  . HAMMER TOE SURGERY      Prior to Admission medications   Medication Sig Start Date End Date Taking? Authorizing Provider  famotidine (PEPCID) 40 MG tablet Take 1 tablet (40 mg total) by mouth every evening. 01/27/15 01/27/16  Earleen Newport, MD  fluticasone (FLONASE) 50 MCG/ACT nasal spray Place into both nostrils daily.    Historical Provider, MD  ibuprofen (ADVIL,MOTRIN) 200 MG tablet Take 200 mg by mouth every 6 (six) hours as needed.    Historical Provider, MD  oxyCODONE-acetaminophen (PERCOCET) 5-325 MG tablet Take 2 tablets by mouth every 6 (six) hours as needed for  moderate pain or severe pain. 01/27/15   Earleen Newport, MD  Pediatric Multivit-Minerals-C (CHEWABLES MULTIVITAMIN PO) Take by mouth.    Historical Provider, MD  sucralfate (CARAFATE) 1 G tablet Take 1 tablet (1 g total) by mouth 4 (four) times daily. Patient not taking: Reported on 02/14/2015 01/27/15 01/27/16  Earleen Newport, MD    Allergies Patient has no known allergies.  No family history on file.  Social History Social History  Substance Use Topics  . Smoking status: Former Research scientist (life sciences)  . Smokeless tobacco: Never Used  . Alcohol use No     Review of Systems  Constitutional: No fatigue Musculoskeletal: Positive left forearm pain. Negative for neck, back pain.  Skin: Positive swelling and bruising left forearm. Negative for rash. Neurological: Negative for numbness, weakness, tingling.  ____________________________________________   PHYSICAL EXAM:  VITAL SIGNS: ED Triage Vitals  Enc Vitals Group     BP 03/28/16 1210 125/60     Pulse Rate 03/28/16 1210 78     Resp 03/28/16 1210 18     Temp 03/28/16 1210 97.6 F (36.4 C)     Temp Source 03/28/16 1210 Oral     SpO2 03/28/16 1210 100 %     Weight 03/28/16 1211 190 lb (86.2 kg)     Height 03/28/16 1211 5\' 4"  (1.626 m)     Head Circumference --      Peak Flow --      Pain Score 03/28/16  1211 10     Pain Loc --      Pain Edu? --      Excl. in [place]? --      Constitutional: Alert and oriented. Well appearing and in no acute distress. Eyes: Conjunctivae are normal.  Head: Atraumatic. Cardiovascular:  Good peripheral circulationWith 2+ pulses noted in the left upper extremity. Capillary refill is brisk in all digits of left hand. Respiratory: Normal respiratory effort without tachypnea or retractions. Musculoskeletal: Severe tenderness to palpation about the distal left forearm with visible deformity noted. Patient is unable to pronate or supinate her forearm nor can she move the left wrist without severe pain.  Full range of motion of the left fingers without difficulty. Neurologic:  Normal speech and language. No gross focal neurologic deficits are appreciated. Facial light touch grossly intact but the left upper extremity. Skin:  Swelling and bruising noted about the distal left forearm. Skin is warm, dry and intact. No rash, redness, abnormal warmth, open wounds or lacerations noted. Psychiatric: Mood and affect are normal. Speech and behavior are normal. Patient exhibits appropriate insight and judgement.   ____________________________________________   LABS (all labs ordered are listed, but only abnormal results are displayed)  Labs Reviewed  BASIC METABOLIC PANEL - Abnormal; Notable for the following:       Result Value   Glucose, Bld 119 (*)    BUN 22 (*)    All other components within normal limits  CBC WITH DIFFERENTIAL/PLATELET - Abnormal; Notable for the following:    RBC 5.21 (*)    All other components within normal limits   ____________________________________________  EKG  None ____________________________________________  RADIOLOGY I, Jami L Hagler, personally viewed and evaluated these images (plain radiographs) as part of my medical decision making, as well as reviewing the written report by the radiologist.  Dg Forearm Left  Result Date: 03/28/2016 CLINICAL DATA:  [facility] Pt fell in parking lot reports left distal forearm pain; deformity noted; unable to supinate hand; did PA view and x table lateral EXAM: LEFT FOREARM - 2 VIEW COMPARISON:  11/08/2008 FINDINGS: There is a comminuted fracture of the distal left radius. An oblique fracture component extends from the radial aspect of the metadiaphysis distally to the distal metaphysis. A vertical fracture component extends to the articular surface between the lunate and scaphoid facets. There is no significant fracture displacement. There is volar angulation of the fracture of approximately 15 degrees.  There are no other fractures. The elbow and wrist joints are normally aligned. IMPRESSION: Comminuted fracture of the distal left radius with mild volar angulation as described. Electronically Signed   By: Lajean Manes M.D.   On: 03/28/2016 12:37    ____________________________________________    PROCEDURES  Procedure(s) performed: None   Procedures   Medications  fentaNYL (SUBLIMAZE) injection 50 mcg (50 mcg Intravenous Given 03/28/16 1304)     ____________________________________________   INITIAL IMPRESSION / ASSESSMENT AND PLAN / ED COURSE  Pertinent labs & imaging results that were available during my care of the patient were reviewed by me and considered in my medical decision making (see chart for details).  Clinical Course as of Mar 28 1325  Thu Mar 28, 2016  1315 I spoke with Dr. Rudene Christians, orthopedic physician on call. He suggests the patient be placed ina volar splint and follow up in his office Monday at 10am. She will receive a phone call from his office to confirm her appointment. It is anticipated the  patient will need surgery but the injury does not warrant admission and surgical intervention emergently.  [JH]    Clinical Course User Index [JH] Jami L Hagler, PA-C    Patient's diagnosis is consistent with Closed fracture of the distal end of the left radius. Patient was placed in a hole are splint and given an arm sling as suggested by Dr. Rudene Christians. Patient will be discharged home with prescriptions for Norco to take as directed. Patient understands that she is to ice the affected area 20 minutes 3-4 times daily and keep it elevated at all times. Patient is to follow up with Dr. Rudene Christians on Monday at [facility] for further evaluation and treatment of fracture. Patient was given a work note that she would remain out of work until seen by orthopedics. Patient is given ED precautions to return to the ED for any worsening or new symptoms.     ____________________________________________  FINAL CLINICAL IMPRESSION(S) / ED DIAGNOSES  Final diagnoses:  Closed fracture of distal end of left radius, unspecified fracture morphology, initial encounter      NEW MEDICATIONS STARTED DURING THIS VISIT:  New Prescriptions   No medications on file         Braxton Feathers, PA-C 03/28/16 [place], MD 03/29/16 [phone]

## 2016-04-01 ENCOUNTER — Encounter
Admission: RE | Admit: 2016-04-01 | Discharge: 2016-04-01 | Disposition: A | Discharge status: Home / Self Care | Payer: 59 | Source: Ambulatory Visit | Attending: Orthopedic Surgery | Admitting: Orthopedic Surgery

## 2016-04-01 DIAGNOSIS — S52572A Other intraarticular fracture of lower end of left radius, initial encounter for closed fracture: Secondary | ICD-10-CM | POA: Diagnosis not present

## 2016-04-01 HISTORY — DX: Adverse effect of unspecified anesthetic, initial encounter: T41.45XA

## 2016-04-01 HISTORY — DX: Unspecified osteoarthritis, unspecified site: M19.90

## 2016-04-01 HISTORY — DX: Other complications of anesthesia, initial encounter: T88.59XA

## 2016-04-01 HISTORY — DX: Anxiety disorder, unspecified: F41.9

## 2016-04-01 NOTE — Patient Instructions (Signed)
  Your procedure is scheduled on: 04-04-16 Report to Same Day Surgery 2nd floor medical mall [facility] Entrance-take elevator on left to 2nd floor.  Check in with surgery information desk.) To find out your arrival time please call [phone] between 1PM - 3PM on 04-03-16  Remember: Instructions that are not followed completely may result in serious medical risk, up to and including death, or upon the discretion of your surgeon and anesthesiologist your surgery may need to be rescheduled.    _x___ 1. Do not eat food or drink liquids after midnight. No gum chewing or hard candies.     __x__ 2. No Alcohol for 24 hours before or after surgery.   __x__3. No Smoking for 24 prior to surgery.   ____  4. Bring all medications with you on the day of surgery if instructed.    __x__ 5. Notify your doctor if there is any change in your medical condition     (cold, fever, infections).     Do not wear jewelry, make-up, hairpins, clips or nail polish.  Do not wear lotions, powders, or perfumes. You may wear deodorant.  Do not shave 48 hours prior to surgery. Men may shave face and neck.  Do not bring valuables to the hospital.    [facility] is not responsible for any belongings or valuables.               Contacts, dentures or bridgework may not be worn into surgery.  Leave your suitcase in the car. After surgery it may be brought to your room.  For patients admitted to the hospital, discharge time is determined by your treatment team.   Patients discharged the day of surgery will not be allowed to drive home.  You will need someone to drive you home and stay with you the night of your procedure.    Please read over the following fact sheets that you were given:   [facility] Preparing for Surgery and or MRSA Information   ____ Take these medicines the morning of surgery with A SIP OF WATER:    1. NONE  2.  3.  4.  5.  6.  ____Fleets enema or Magnesium Citrate as directed.   ____  Use CHG Soap or sage wipes as directed on instruction sheet   ____ Use inhalers on the day of surgery and bring to hospital day of surgery  ____ Stop metformin 2 days prior to surgery    ____ Take 1/2 of usual insulin dose the night before surgery and none on the morning of   surgery.   ____ Stop Aspirin, Coumadin, Pllavix ,Eliquis, Effient, or Pradaxa  x__ Stop Anti-inflammatories such as Advil, Aleve, Ibuprofen, Motrin, Naproxen,          Naprosyn, Goodies powders or aspirin products. Ok to take Tylenol OR HYDROCODONE IF NEEDED   ____ Stop supplements until after surgery.    ____ Bring C-Pap to the hospital.

## 2016-04-02 ENCOUNTER — Inpatient Hospital Stay: Admission: RE | Admit: 2016-04-02 | Payer: 59 | Source: Ambulatory Visit

## 2016-04-03 MED ORDER — CEFAZOLIN SODIUM-DEXTROSE 2-4 GM/100ML-% IV SOLN
2.0000 g | Freq: Once | INTRAVENOUS | Status: AC
  Administered 2016-04-04: 2 g via INTRAVENOUS

## 2016-04-04 ENCOUNTER — Encounter: Payer: Self-pay | Admitting: *Deleted

## 2016-04-04 ENCOUNTER — Ambulatory Visit: Payer: Commercial Managed Care - HMO | Admitting: Anesthesiology

## 2016-04-04 ENCOUNTER — Ambulatory Visit
Admission: RE | Admit: 2016-04-04 | Discharge: 2016-04-04 | Disposition: A | Discharge status: Home / Self Care | Payer: Commercial Managed Care - HMO | Source: Ambulatory Visit | Attending: Orthopedic Surgery | Admitting: Orthopedic Surgery

## 2016-04-04 ENCOUNTER — Ambulatory Visit: Payer: Commercial Managed Care - HMO

## 2016-04-04 ENCOUNTER — Encounter
Admission: RE | Disposition: A | Discharge status: Home / Self Care | Payer: Self-pay | Source: Ambulatory Visit | Attending: Orthopedic Surgery

## 2016-04-04 DIAGNOSIS — Z791 Long term (current) use of non-steroidal anti-inflammatories (NSAID): Secondary | ICD-10-CM | POA: Diagnosis not present

## 2016-04-04 DIAGNOSIS — M1990 Unspecified osteoarthritis, unspecified site: Secondary | ICD-10-CM | POA: Diagnosis not present

## 2016-04-04 DIAGNOSIS — Z79899 Other long term (current) drug therapy: Secondary | ICD-10-CM | POA: Diagnosis not present

## 2016-04-04 DIAGNOSIS — K219 Gastro-esophageal reflux disease without esophagitis: Secondary | ICD-10-CM | POA: Diagnosis not present

## 2016-04-04 DIAGNOSIS — E785 Hyperlipidemia, unspecified: Secondary | ICD-10-CM | POA: Insufficient documentation

## 2016-04-04 DIAGNOSIS — W000XXA Fall on same level due to ice and snow, initial encounter: Secondary | ICD-10-CM | POA: Insufficient documentation

## 2016-04-04 DIAGNOSIS — S52202A Unspecified fracture of shaft of left ulna, initial encounter for closed fracture: Secondary | ICD-10-CM | POA: Diagnosis not present

## 2016-04-04 DIAGNOSIS — S52572A Other intraarticular fracture of lower end of left radius, initial encounter for closed fracture: Secondary | ICD-10-CM | POA: Diagnosis not present

## 2016-04-04 DIAGNOSIS — Z87891 Personal history of nicotine dependence: Secondary | ICD-10-CM | POA: Diagnosis not present

## 2016-04-04 DIAGNOSIS — F419 Anxiety disorder, unspecified: Secondary | ICD-10-CM | POA: Diagnosis not present

## 2016-04-04 DIAGNOSIS — S63015A Dislocation of distal radioulnar joint of left wrist, initial encounter: Secondary | ICD-10-CM | POA: Insufficient documentation

## 2016-04-04 DIAGNOSIS — Z8781 Personal history of (healed) traumatic fracture: Secondary | ICD-10-CM

## 2016-04-04 DIAGNOSIS — Z9889 Other specified postprocedural states: Secondary | ICD-10-CM

## 2016-04-04 HISTORY — PX: OPEN REDUCTION INTERNAL FIXATION (ORIF) DISTAL RADIAL FRACTURE: SHX5989

## 2016-04-04 SURGERY — OPEN REDUCTION INTERNAL FIXATION (ORIF) DISTAL RADIUS FRACTURE
Anesthesia: General | Site: Wrist | Laterality: Left | Wound class: Clean

## 2016-04-04 MED ORDER — MIDAZOLAM HCL 2 MG/2ML IJ SOLN
INTRAMUSCULAR | Status: DC | PRN
  Administered 2016-04-04: 2 mg via INTRAVENOUS

## 2016-04-04 MED ORDER — PROMETHAZINE HCL 25 MG/ML IJ SOLN: 6.2500 mg | INTRAMUSCULAR | Status: DC | PRN

## 2016-04-04 MED ORDER — SODIUM CHLORIDE 0.9 % IV SOLN: INTRAVENOUS | Status: DC

## 2016-04-04 MED ORDER — FAMOTIDINE 20 MG PO TABS
20.0000 mg | ORAL_TABLET | Freq: Once | ORAL | Status: AC
  Administered 2016-04-04: 20 mg via ORAL

## 2016-04-04 MED ORDER — DEXAMETHASONE SODIUM PHOSPHATE 10 MG/ML IJ SOLN
INTRAMUSCULAR | Status: DC | PRN
  Administered 2016-04-04: 10 mg via INTRAVENOUS

## 2016-04-04 MED ORDER — NEOMYCIN-POLYMYXIN B GU 40-200000 IR SOLN
Status: AC
  Filled 2016-04-04: qty 2

## 2016-04-04 MED ORDER — DEXAMETHASONE SODIUM PHOSPHATE 10 MG/ML IJ SOLN
INTRAMUSCULAR | Status: AC
Start: 2016-04-04 — End: 2016-04-04
  Filled 2016-04-04: qty 1

## 2016-04-04 MED ORDER — PROPOFOL 10 MG/ML IV BOLUS
INTRAVENOUS | Status: AC
  Filled 2016-04-04: qty 20

## 2016-04-04 MED ORDER — FENTANYL CITRATE (PF) 100 MCG/2ML IJ SOLN
INTRAMUSCULAR | Status: AC
  Filled 2016-04-04: qty 2

## 2016-04-04 MED ORDER — PROPOFOL 10 MG/ML IV BOLUS
INTRAVENOUS | Status: DC | PRN
  Administered 2016-04-04: 160 mg via INTRAVENOUS

## 2016-04-04 MED ORDER — FAMOTIDINE 20 MG PO TABS
ORAL_TABLET | ORAL | Status: AC
  Filled 2016-04-04: qty 1

## 2016-04-04 MED ORDER — METOCLOPRAMIDE HCL 5 MG/ML IJ SOLN: 5.0000 mg | Freq: Three times a day (TID) | INTRAMUSCULAR | Status: DC | PRN

## 2016-04-04 MED ORDER — ONDANSETRON HCL 4 MG/2ML IJ SOLN
INTRAMUSCULAR | Status: AC
  Administered 2016-04-04: 4 mg via INTRAVENOUS
  Filled 2016-04-04: qty 2

## 2016-04-04 MED ORDER — HYDROCODONE-ACETAMINOPHEN 5-325 MG PO TABS: 1.0000 | ORAL_TABLET | ORAL | Status: DC | PRN

## 2016-04-04 MED ORDER — LIDOCAINE HCL (PF) 2 % IJ SOLN
INTRAMUSCULAR | Status: AC
  Filled 2016-04-04: qty 2

## 2016-04-04 MED ORDER — MIDAZOLAM HCL 2 MG/2ML IJ SOLN
INTRAMUSCULAR | Status: AC
  Filled 2016-04-04: qty 2

## 2016-04-04 MED ORDER — LACTATED RINGERS IV SOLN
INTRAVENOUS | Status: DC
  Administered 2016-04-04 (×2): via INTRAVENOUS

## 2016-04-04 MED ORDER — SCOPOLAMINE 1 MG/3DAYS TD PT72
1.0000 | MEDICATED_PATCH | TRANSDERMAL | Status: DC
  Administered 2016-04-04: 1.5 mg via TRANSDERMAL

## 2016-04-04 MED ORDER — FENTANYL CITRATE (PF) 100 MCG/2ML IJ SOLN
INTRAMUSCULAR | Status: DC | PRN
  Administered 2016-04-04: 50 ug via INTRAVENOUS
  Administered 2016-04-04: 25 ug via INTRAVENOUS

## 2016-04-04 MED ORDER — NEOMYCIN-POLYMYXIN B GU 40-200000 IR SOLN
Status: DC | PRN
  Administered 2016-04-04: 2 mL

## 2016-04-04 MED ORDER — HYDROMORPHONE HCL 1 MG/ML IJ SOLN
0.5000 mg | INTRAMUSCULAR | Status: DC | PRN
  Administered 2016-04-04 (×3): 0.5 mg via INTRAVENOUS

## 2016-04-04 MED ORDER — ONDANSETRON HCL 4 MG PO TABS: 4.0000 mg | ORAL_TABLET | Freq: Four times a day (QID) | ORAL | Status: DC | PRN

## 2016-04-04 MED ORDER — HYDROMORPHONE HCL 1 MG/ML IJ SOLN
INTRAMUSCULAR | Status: AC
Start: 2016-04-04 — End: 2016-04-04
  Administered 2016-04-04: 0.5 mg via INTRAVENOUS
  Filled 2016-04-04: qty 1

## 2016-04-04 MED ORDER — FENTANYL CITRATE (PF) 100 MCG/2ML IJ SOLN
INTRAMUSCULAR | Status: AC
  Administered 2016-04-04: 50 ug via INTRAVENOUS
  Filled 2016-04-04: qty 2

## 2016-04-04 MED ORDER — HYDROMORPHONE HCL 1 MG/ML IJ SOLN
INTRAMUSCULAR | Status: AC
  Administered 2016-04-04: 0.5 mg via INTRAVENOUS
  Filled 2016-04-04: qty 1

## 2016-04-04 MED ORDER — ONDANSETRON HCL 4 MG/2ML IJ SOLN
INTRAMUSCULAR | Status: DC | PRN
  Administered 2016-04-04: 4 mg via INTRAVENOUS

## 2016-04-04 MED ORDER — ONDANSETRON HCL 4 MG/2ML IJ SOLN
INTRAMUSCULAR | Status: AC
  Filled 2016-04-04: qty 2

## 2016-04-04 MED ORDER — CEFAZOLIN SODIUM-DEXTROSE 2-4 GM/100ML-% IV SOLN
INTRAVENOUS | Status: AC
  Filled 2016-04-04: qty 100

## 2016-04-04 MED ORDER — FENTANYL CITRATE (PF) 100 MCG/2ML IJ SOLN
25.0000 ug | INTRAMUSCULAR | Status: DC | PRN
  Administered 2016-04-04 (×3): 50 ug via INTRAVENOUS

## 2016-04-04 MED ORDER — METOCLOPRAMIDE HCL 10 MG PO TABS: 5.0000 mg | ORAL_TABLET | Freq: Three times a day (TID) | ORAL | Status: DC | PRN

## 2016-04-04 MED ORDER — SCOPOLAMINE 1 MG/3DAYS TD PT72
MEDICATED_PATCH | TRANSDERMAL | Status: AC
  Filled 2016-04-04: qty 1

## 2016-04-04 MED ORDER — ONDANSETRON HCL 4 MG/2ML IJ SOLN
4.0000 mg | Freq: Four times a day (QID) | INTRAMUSCULAR | Status: DC | PRN
  Administered 2016-04-04: 4 mg via INTRAVENOUS

## 2016-04-04 SURGICAL SUPPLY — 45 items
BANDAGE ACE 4X5 VEL STRL LF (GAUZE/BANDAGES/DRESSINGS) ×3 IMPLANT
BIT DRILL 2 FAST STEP (BIT) ×3 IMPLANT
CANISTER SUCT 1200ML W/VALVE (MISCELLANEOUS) ×3 IMPLANT
CHLORAPREP W/TINT 26ML (MISCELLANEOUS) ×3 IMPLANT
CUFF TOURN 18 STER (MISCELLANEOUS) ×3 IMPLANT
DRAPE FLUOR MINI C-ARM 54X84 (DRAPES) ×3 IMPLANT
DRILL GUIDE DBL 2.5/3.5MM (INSTRUMENTS) ×3 IMPLANT
ELECT REM PT RETURN 9FT ADLT (ELECTROSURGICAL) ×3
ELECTRODE REM PT RTRN 9FT ADLT (ELECTROSURGICAL) ×1 IMPLANT
GAUZE PETRO XEROFOAM 1X8 (MISCELLANEOUS) ×6 IMPLANT
GAUZE SPONGE 4X4 12PLY STRL (GAUZE/BANDAGES/DRESSINGS) ×3 IMPLANT
GLOVE BIOGEL PI IND STRL 9 (GLOVE) ×1 IMPLANT
GLOVE BIOGEL PI INDICATOR 9 (GLOVE) ×2
GLOVE SURG SYN 9.0  PF PI (GLOVE) ×2
GLOVE SURG SYN 9.0 PF PI (GLOVE) ×1 IMPLANT
GOWN SRG 2XL LVL 4 RGLN SLV (GOWNS) ×1 IMPLANT
GOWN STRL NON-REIN 2XL LVL4 (GOWNS) ×2
GOWN STRL REUS W/ TWL LRG LVL3 (GOWN DISPOSABLE) ×1 IMPLANT
GOWN STRL REUS W/TWL LRG LVL3 (GOWN DISPOSABLE) ×2
K-WIRE 1.6 (WIRE) ×2
K-WIRE FX5X1.6XNS BN SS (WIRE) ×1
KIT RM TURNOVER STRD PROC AR (KITS) ×3 IMPLANT
KWIRE FX5X1.6XNS BN SS (WIRE) ×1 IMPLANT
NEEDLE FILTER BLUNT 18X 1/2SAF (NEEDLE) ×2
NEEDLE FILTER BLUNT 18X1 1/2 (NEEDLE) ×1 IMPLANT
NS IRRIG 500ML POUR BTL (IV SOLUTION) ×3 IMPLANT
PACK EXTREMITY ARMC (MISCELLANEOUS) ×3 IMPLANT
PAD CAST CTTN 4X4 STRL (SOFTGOODS) ×2 IMPLANT
PADDING CAST COTTON 4X4 STRL (SOFTGOODS) ×4
PEG SUBCHONDRAL SMOOTH 2.0X16 (Peg) ×3 IMPLANT
PEG SUBCHONDRAL SMOOTH 2.0X20 (Peg) ×3 IMPLANT
PEG SUBCHONDRAL SMOOTH 2.0X22 (Peg) IMPLANT
PEG SUBCHONDRAL SMOOTH 2.0X24 (Peg) ×6 IMPLANT
PLATE SHORT 24.4X51.3 LT (Plate) ×3 IMPLANT
SCREW BN 12X3.5XNS CORT TI (Screw) IMPLANT
SCREW CORT 3.5X10 LNG (Screw) ×12 IMPLANT
SCREW CORT 3.5X12 (Screw) IMPLANT
SCREW PEG LOCK 2.5X24 (Peg) ×3 IMPLANT
SPLINT CAST 1 STEP 3X12 (MISCELLANEOUS) ×3 IMPLANT
STOCKINETTE STRL 4IN 9604848 (GAUZE/BANDAGES/DRESSINGS) ×3 IMPLANT
SUT ETHILON 4-0 (SUTURE) ×2
SUT ETHILON 4-0 FS2 18XMFL BLK (SUTURE) ×1
SUT VICRYL 3-0 27IN (SUTURE) ×3 IMPLANT
SUTURE ETHLN 4-0 FS2 18XMF BLK (SUTURE) ×1 IMPLANT
SYR 3ML LL SCALE MARK (SYRINGE) ×3 IMPLANT

## 2016-04-04 NOTE — H&P (Signed)
Reviewed paper H+P, will be scanned into chart. No changes noted.  

## 2016-04-04 NOTE — Transfer of Care (Signed)
Immediate Anesthesia Transfer of Care Note  Patient: Alexa Garcia  Procedure(s) Performed: Procedure(s): OPEN REDUCTION INTERNAL FIXATION (ORIF) DISTAL RADIAL FRACTURE,  CLOSED REDUCTION PINNING OF DISTAL RADIAL ULNAR JOINT (Left)  Patient Location: PACU  Anesthesia Type:General  Level of Consciousness: awake  Airway & Oxygen Therapy: Patient Spontanous Breathing and Patient connected to face mask oxygen  Post-op Assessment: Report given to RN and Post -op Vital signs reviewed and stable  Post vital signs: Reviewed and stable  Last Vitals:  Vitals:   04/04/16 1038 04/04/16 1433  BP: 132/79 115/66  Pulse: 74 (!) 46  Resp: 16 (!) 8  Temp: 36.4 C 37.3 C    Last Pain:  Vitals:   04/04/16 1038  TempSrc: Oral  PainSc: 2          Complications: No apparent anesthesia complications

## 2016-04-04 NOTE — Anesthesia Post-op Follow-up Note (Cosign Needed)
Anesthesia QCDR form completed.        

## 2016-04-04 NOTE — Anesthesia Preprocedure Evaluation (Signed)
Anesthesia Evaluation  Patient identified by MRN, date of birth, ID band Patient awake    Reviewed: Allergy & Precautions, H&P , NPO status , Patient's Chart, lab work & pertinent test results, reviewed documented beta blocker date and time   History of Anesthesia Complications (+) PONV and history of anesthetic complications  Airway Mallampati: I  TM Distance: >3 FB Neck ROM: full    Dental  (+) Caps Temporary bridge on the top:   Pulmonary neg pulmonary ROS, former smoker,           Cardiovascular Exercise Tolerance: Good negative cardio ROS       Neuro/Psych negative neurological ROS  negative psych ROS   GI/Hepatic Neg liver ROS, GERD  ,  Endo/Other  negative endocrine ROS  Renal/GU negative Renal ROS  negative genitourinary   Musculoskeletal   Abdominal   Peds  Hematology negative hematology ROS (+)   Anesthesia Other Findings Past Medical History: No date: Anxiety No date: Arthritis     Comment: BACK No date: Complication of anesthesia     Comment: MASK MADE PT VERY ANXIOUS AND SHE FELT LIKE               SHE COULDNT BREATHE No date: GERD (gastroesophageal reflux disease)     Comment: H/O No date: Headache     Comment: H/O migranes No date: Hyperlipidemia   Reproductive/Obstetrics negative OB ROS                             Anesthesia Physical Anesthesia Plan  ASA: II  Anesthesia Plan: General   Post-op Pain Management:    Induction:   Airway Management Planned:   Additional Equipment:   Intra-op Plan:   Post-operative Plan:   Informed Consent: I have reviewed the patients History and Physical, chart, labs and discussed the procedure including the risks, benefits and alternatives for the proposed anesthesia with the patient or authorized representative who has indicated his/her understanding and acceptance.   Dental Advisory Given  Plan Discussed with:  Anesthesiologist, CRNA and Surgeon  Anesthesia Plan Comments:         Anesthesia Quick Evaluation

## 2016-04-04 NOTE — Op Note (Signed)
04/04/2016  2:32 PM  PATIENT:  Alexa Garcia  51 y.o. female  PRE-OPERATIVE DIAGNOSIS:  OTHER CLOSE INTRA-ARTICULARFRACTURE DISTAL END LEFT RADIUS  POST-OPERATIVE DIAGNOSIS:  OTHER CLOSE INTRA-ARTICULARFRACTURE DISTAL END LEFT RADIUS and dislocation DRUJ  PROCEDURE:  Procedure(s): OPEN REDUCTION INTERNAL FIXATION (ORIF) DISTAL RADIAL FRACTURE,  CLOSED REDUCTION PINNING OF DISTAL RADIAL ULNAR JOINT (Left)  SURGEON: Laurene Footman, MD  ASSISTANTS: None  ANESTHESIA:   general  EBL:  No intake/output data recorded.  BLOOD ADMINISTERED:none  DRAINS: none   LOCAL MEDICATIONS USED:  NONE  SPECIMEN:  No Specimen  DISPOSITION OF SPECIMEN:  N/A  COUNTS:  YES  TOURNIQUET:   43 minutes at 250 mmHg  IMPLANTS: [employer] DVR long left plate with multiple screws and pegs, one K wire for DRUJ  DICTATION: .Dragon Dictation patient brought the operating room and after adequate anesthesia was obtained the left arm was prepped and draped in sterile fashion. After appropriate patient identification and timeout procedure were completed fingertrap traction applied to the index and middle fingers and traction applied off the end of the table proximally pounds. Tourniquet was raised and a volar approach is made over the FCR tendon. Exposing this distally the proximal the radial artery and associated veins were protected along the FCR tendon as the deep fascia was incised and the tendon retracted radially and the pronator was elevated off the distal fragment and the shaft was some fairly exposed secondary to the injury and unknotted great deal more additional muscle stripping was required. The fracture was reduced and was a rotational deformity present as well shortening and reduction clamp used to hold it in position. The volar plate was then applied with proximal and distal screws placed to hold it in position. With 4 proximal cortical screws, attention was turned distally where multiple pegs  were placed and the distal locking portion of the plate in the metaphyseal bone. Not all screw holes were filled as it was not deemed necessary. At this point the radius appeared to be anatomically reduced and was clear with rotation of the DRUJ was unstable and so it was pinned in position with the arm in supination in a reduced position with a pin placed through a small incision over the ulna subcutaneous tissues tissue spread and the K wire advanced through the ulna into the distal radius with arm in supination the pin was bent over the skin and cut short at this point the tourniquet was let down and the main incision was irrigated and closed with 3-0 Vicryl subcutaneously and 4-0 nylon for the skin. Xeroform was placed around the pin site as well as along the volar incision 4 x 4's web roll and then a long arm splint with the arm in supination  PLAN OF CARE: Discharge to home after PACU  PATIENT DISPOSITION:  PACU - hemodynamically stable.

## 2016-04-04 NOTE — Discharge Instructions (Addendum)
Keep arm elevated is much as possible with ice on the wrist. Don't try to rotate forearm. Work on finger motion is much as possible   AMBULATORY SURGERY  DISCHARGE INSTRUCTIONS   1) The drugs that you were given will stay in your system until tomorrow so for the next 24 hours you should not:  A) Drive an automobile B) Make any legal decisions C) Drink any alcoholic beverage   2) You may resume regular meals tomorrow.  Today it is better to start with liquids and gradually work up to solid foods.  You may eat anything you prefer, but it is better to start with liquids, then soup and crackers, and gradually work up to solid foods.   3) Please notify your doctor immediately if you have any unusual bleeding, trouble breathing, redness and pain at the surgery site, drainage, fever, or pain not relieved by medication.    4) Additional Instructions:        Please contact your physician with any problems or Same Day Surgery at [phone], Monday through Friday 6 am to 4 pm, or [place] at [facility] number at [phone].

## 2016-04-05 ENCOUNTER — Encounter: Payer: Self-pay | Admitting: Orthopedic Surgery

## 2016-04-05 NOTE — Anesthesia Postprocedure Evaluation (Signed)
Anesthesia Post Note  Patient: Steven B Marolf  Procedure(s) Performed: Procedure(s) (LRB): OPEN REDUCTION INTERNAL FIXATION (ORIF) DISTAL RADIAL FRACTURE,  CLOSED REDUCTION PINNING OF DISTAL RADIAL ULNAR JOINT (Left)  Patient location during evaluation: PACU Anesthesia Type: General Level of consciousness: awake and alert Pain management: pain level controlled Vital Signs Assessment: post-procedure vital signs reviewed and stable Respiratory status: spontaneous breathing, nonlabored ventilation, respiratory function stable and patient connected to nasal cannula oxygen Cardiovascular status: blood pressure returned to baseline and stable Postop Assessment: no signs of nausea or vomiting Anesthetic complications: no     Last Vitals:  Vitals:   04/04/16 1545 04/04/16 1604  BP: 126/78 111/66  Pulse: (!) 50   Resp: 15 16  Temp:      Last Pain:  Vitals:   04/05/16 0836  TempSrc:   PainSc: 0-No pain                 Martha Clan

## 2016-04-08 DIAGNOSIS — S52572A Other intraarticular fracture of lower end of left radius, initial encounter for closed fracture: Secondary | ICD-10-CM | POA: Diagnosis not present

## 2016-04-08 DIAGNOSIS — S63012A Subluxation of distal radioulnar joint of left wrist, initial encounter: Secondary | ICD-10-CM | POA: Diagnosis not present

## 2016-04-19 DIAGNOSIS — S52572D Other intraarticular fracture of lower end of left radius, subsequent encounter for closed fracture with routine healing: Secondary | ICD-10-CM | POA: Diagnosis not present

## 2016-04-19 DIAGNOSIS — S63012D Subluxation of distal radioulnar joint of left wrist, subsequent encounter: Secondary | ICD-10-CM | POA: Diagnosis not present

## 2016-05-01 DIAGNOSIS — M722 Plantar fascial fibromatosis: Secondary | ICD-10-CM | POA: Diagnosis not present

## 2016-05-01 DIAGNOSIS — M79672 Pain in left foot: Secondary | ICD-10-CM | POA: Diagnosis not present

## 2016-05-06 DIAGNOSIS — Z8781 Personal history of (healed) traumatic fracture: Secondary | ICD-10-CM | POA: Diagnosis not present

## 2016-05-06 DIAGNOSIS — Z967 Presence of other bone and tendon implants: Secondary | ICD-10-CM | POA: Diagnosis not present

## 2016-05-07 DIAGNOSIS — M6281 Muscle weakness (generalized): Secondary | ICD-10-CM | POA: Diagnosis not present

## 2016-05-07 DIAGNOSIS — M25532 Pain in left wrist: Secondary | ICD-10-CM | POA: Diagnosis not present

## 2016-05-07 DIAGNOSIS — Z967 Presence of other bone and tendon implants: Secondary | ICD-10-CM | POA: Diagnosis not present

## 2016-05-09 DIAGNOSIS — M25632 Stiffness of left wrist, not elsewhere classified: Secondary | ICD-10-CM | POA: Diagnosis not present

## 2016-05-09 DIAGNOSIS — Z967 Presence of other bone and tendon implants: Secondary | ICD-10-CM | POA: Diagnosis not present

## 2016-05-09 DIAGNOSIS — Z8781 Personal history of (healed) traumatic fracture: Secondary | ICD-10-CM | POA: Diagnosis not present

## 2016-05-09 DIAGNOSIS — M6281 Muscle weakness (generalized): Secondary | ICD-10-CM | POA: Diagnosis not present

## 2016-05-14 DIAGNOSIS — M6281 Muscle weakness (generalized): Secondary | ICD-10-CM | POA: Diagnosis not present

## 2016-05-14 DIAGNOSIS — M25632 Stiffness of left wrist, not elsewhere classified: Secondary | ICD-10-CM | POA: Diagnosis not present

## 2016-05-14 DIAGNOSIS — Z8781 Personal history of (healed) traumatic fracture: Secondary | ICD-10-CM | POA: Diagnosis not present

## 2016-05-14 DIAGNOSIS — Z967 Presence of other bone and tendon implants: Secondary | ICD-10-CM | POA: Diagnosis not present

## 2016-05-16 DIAGNOSIS — Z967 Presence of other bone and tendon implants: Secondary | ICD-10-CM | POA: Diagnosis not present

## 2016-05-16 DIAGNOSIS — M6281 Muscle weakness (generalized): Secondary | ICD-10-CM | POA: Diagnosis not present

## 2016-05-16 DIAGNOSIS — M25632 Stiffness of left wrist, not elsewhere classified: Secondary | ICD-10-CM | POA: Diagnosis not present

## 2016-05-16 DIAGNOSIS — M25532 Pain in left wrist: Secondary | ICD-10-CM | POA: Diagnosis not present

## 2016-05-23 DIAGNOSIS — M25532 Pain in left wrist: Secondary | ICD-10-CM | POA: Diagnosis not present

## 2016-05-23 DIAGNOSIS — M6281 Muscle weakness (generalized): Secondary | ICD-10-CM | POA: Diagnosis not present

## 2016-05-23 DIAGNOSIS — M25632 Stiffness of left wrist, not elsewhere classified: Secondary | ICD-10-CM | POA: Diagnosis not present

## 2016-05-23 DIAGNOSIS — Z967 Presence of other bone and tendon implants: Secondary | ICD-10-CM | POA: Diagnosis not present

## 2016-05-28 DIAGNOSIS — M6281 Muscle weakness (generalized): Secondary | ICD-10-CM | POA: Diagnosis not present

## 2016-05-28 DIAGNOSIS — Z967 Presence of other bone and tendon implants: Secondary | ICD-10-CM | POA: Diagnosis not present

## 2016-05-28 DIAGNOSIS — M25632 Stiffness of left wrist, not elsewhere classified: Secondary | ICD-10-CM | POA: Diagnosis not present

## 2016-05-28 DIAGNOSIS — M25532 Pain in left wrist: Secondary | ICD-10-CM | POA: Diagnosis not present

## 2016-05-29 DIAGNOSIS — Z8781 Personal history of (healed) traumatic fracture: Secondary | ICD-10-CM | POA: Diagnosis not present

## 2016-05-29 DIAGNOSIS — Z967 Presence of other bone and tendon implants: Secondary | ICD-10-CM | POA: Diagnosis not present

## 2016-06-04 DIAGNOSIS — R1013 Epigastric pain: Secondary | ICD-10-CM | POA: Diagnosis not present

## 2016-06-04 DIAGNOSIS — M6281 Muscle weakness (generalized): Secondary | ICD-10-CM | POA: Diagnosis not present

## 2016-06-04 DIAGNOSIS — M25532 Pain in left wrist: Secondary | ICD-10-CM | POA: Diagnosis not present

## 2016-06-06 DIAGNOSIS — M25532 Pain in left wrist: Secondary | ICD-10-CM | POA: Diagnosis not present

## 2016-06-06 DIAGNOSIS — M25632 Stiffness of left wrist, not elsewhere classified: Secondary | ICD-10-CM | POA: Diagnosis not present

## 2016-06-06 DIAGNOSIS — M6281 Muscle weakness (generalized): Secondary | ICD-10-CM | POA: Diagnosis not present

## 2016-07-18 ENCOUNTER — Other Ambulatory Visit: Payer: Self-pay | Admitting: Obstetrics and Gynecology

## 2016-07-18 DIAGNOSIS — Z1211 Encounter for screening for malignant neoplasm of colon: Secondary | ICD-10-CM | POA: Diagnosis not present

## 2016-07-18 DIAGNOSIS — Z1231 Encounter for screening mammogram for malignant neoplasm of breast: Secondary | ICD-10-CM

## 2016-07-18 DIAGNOSIS — G8929 Other chronic pain: Secondary | ICD-10-CM | POA: Diagnosis not present

## 2016-07-18 DIAGNOSIS — Z01419 Encounter for gynecological examination (general) (routine) without abnormal findings: Secondary | ICD-10-CM | POA: Diagnosis not present

## 2016-07-18 DIAGNOSIS — M545 Low back pain: Secondary | ICD-10-CM | POA: Diagnosis not present

## 2016-07-24 DIAGNOSIS — Z8781 Personal history of (healed) traumatic fracture: Secondary | ICD-10-CM | POA: Diagnosis not present

## 2016-07-24 DIAGNOSIS — Z967 Presence of other bone and tendon implants: Secondary | ICD-10-CM | POA: Diagnosis not present

## 2016-08-13 ENCOUNTER — Ambulatory Visit
Admission: RE | Admit: 2016-08-13 | Discharge: 2016-08-13 | Disposition: A | Discharge status: Home / Self Care | Payer: 59 | Source: Ambulatory Visit | Attending: Obstetrics and Gynecology | Admitting: Obstetrics and Gynecology

## 2016-08-13 DIAGNOSIS — Z1231 Encounter for screening mammogram for malignant neoplasm of breast: Secondary | ICD-10-CM | POA: Diagnosis not present

## 2016-08-19 ENCOUNTER — Encounter: Payer: Self-pay | Admitting: Emergency Medicine

## 2016-08-19 ENCOUNTER — Emergency Department: Payer: 59

## 2016-08-19 ENCOUNTER — Emergency Department
Admission: EM | Admit: 2016-08-19 | Discharge: 2016-08-19 | Disposition: A | Discharge status: Home / Self Care | Payer: 59 | Attending: Student in an Organized Health Care Education/Training Program | Admitting: Student in an Organized Health Care Education/Training Program

## 2016-08-19 DIAGNOSIS — Z87891 Personal history of nicotine dependence: Secondary | ICD-10-CM | POA: Insufficient documentation

## 2016-08-19 DIAGNOSIS — R202 Paresthesia of skin: Secondary | ICD-10-CM | POA: Insufficient documentation

## 2016-08-19 DIAGNOSIS — R200 Anesthesia of skin: Secondary | ICD-10-CM | POA: Insufficient documentation

## 2016-08-19 DIAGNOSIS — Z9889 Other specified postprocedural states: Secondary | ICD-10-CM | POA: Insufficient documentation

## 2016-08-19 DIAGNOSIS — M7989 Other specified soft tissue disorders: Secondary | ICD-10-CM | POA: Diagnosis not present

## 2016-08-19 NOTE — Discharge Instructions (Signed)
Follow up with Dr. Rudene Christians.  Return for worsening symptoms.

## 2016-08-19 NOTE — ED Triage Notes (Signed)
Pt here with concerns for a blood clot in left arm, reports had ortho surgery a few weeks ago with Dr Rudene Christians. Pt with small bruise to [facility]. Pt denies pain, reports Dr Rudene Christians is aware that left arm has been numb since the surgery.

## 2016-08-19 NOTE — ED Provider Notes (Signed)
[facility] Emergency Department Provider Note    First MD Initiated Contact with Patient 08/19/16 [phone]     (approximate)  I have reviewed the triage vital signs and the nursing notes.   HISTORY  Chief Complaint Arm pain    HPI Alexa Garcia is a 51 y.o. female with concern for blood clot and left arm. Patient had a orthopedics surgery several weeks ago in the left arm and feels that his swelling. States that she's also noticed numbness and tingling in her hand but has been discussing this with her orthopedic surgeon. The numbness and tingling is not new. She is in the ER today due to concern for blood clot. Denies any chest pain or shortness of breath.    Past Medical History:  Diagnosis Date  . Anxiety   . Arthritis    BACK  . Complication of anesthesia    MASK MADE PT VERY ANXIOUS AND SHE FELT [place]  . GERD (gastroesophageal reflux disease)    H/O  . Headache    H/O migranes  . Hyperlipidemia    Family History  Problem Relation Age of Onset  . Breast cancer Neg Hx    Past Surgical History:  Procedure Laterality Date  . ABDOMINAL HYSTERECTOMY    . APPENDECTOMY    . BUNIONECTOMY    . COLONOSCOPY N/A 02/14/2015   Procedure: COLONOSCOPY;  Surgeon: Lollie Sails, MD;  Location: [facility] ENDOSCOPY;  Service: Endoscopy;  Laterality: N/A;  . HAMMER TOE SURGERY    . OPEN REDUCTION INTERNAL FIXATION (ORIF) DISTAL RADIAL FRACTURE Left 04/04/2016   Procedure: OPEN REDUCTION INTERNAL FIXATION (ORIF) DISTAL RADIAL FRACTURE,  CLOSED REDUCTION PINNING OF DISTAL RADIAL ULNAR JOINT;  Surgeon: Hessie Knows, MD;  Location: [facility] ORS;  Service: Orthopedics;  Laterality: Left;   There are no active problems to display for this patient.     Prior to Admission medications   Medication Sig Start Date End Date Taking? Authorizing Provider  fluticasone (FLONASE) 50 MCG/ACT nasal spray Place 1-2 sprays into both nostrils daily as needed  (for allergies.).     [provider]  HYDROcodone-acetaminophen (NORCO) 5-325 MG tablet Take 1 tablet by mouth every 6 (six) hours as needed for severe pain. Patient not taking: Reported on 04/01/2016 03/28/16   Hagler, Wende Crease L, PA-C    Allergies Patient has no known allergies.    Social History Social History  Substance Use Topics  . Smoking status: Former Smoker    Packs/day: 1.00    Years: 15.00    Types: Cigarettes    Quit date: 04/01/2001  . Smokeless tobacco: Never Used  . Alcohol use Yes     Comment: SOCIAL    Review of Systems Patient denies headaches, rhinorrhea, blurry vision, numbness, shortness of breath, chest pain, edema, cough, abdominal pain, nausea, vomiting, diarrhea, dysuria, fevers, rashes or hallucinations unless otherwise stated above in HPI. ____________________________________________   PHYSICAL EXAM:  VITAL SIGNS: Vitals:   08/19/16 0834 08/19/16 1131  BP: 127/77 127/87  Pulse: 69 60  Resp: 20   Temp: 98.2 F (36.8 C)     Constitutional: Alert and oriented. Well appearing and in no acute distress. Eyes: Conjunctivae are normal.  Head: Atraumatic. Nose: No congestion/rhinnorhea. Mouth/Throat: Mucous membranes are moist.   Neck: Painless ROM.  Cardiovascular:   Good peripheral circulation. Respiratory: Normal respiratory effort.  No retractions.  Gastrointestinal: Soft and nontender.  Musculoskeletal: No lower extremity tenderness .  No joint effusions.  Ecchymosis to small area of left [facility] Neurologic:  Normal speech and language. No gross focal neurologic deficits are appreciated.  Skin:  Skin is warm, dry and intact. No rash noted. Psychiatric: Mood and affect are normal. Speech and behavior are normal.  ____________________________________________   LABS (all labs ordered are listed, but only abnormal results are displayed)  No results found for this or any previous visit (from the past 24  hour(s)). ____________________________________________ ____________________________________________  [id number]  I personally reviewed all radiographic images ordered to evaluate for the above acute complaints and reviewed radiology reports and findings.  These findings were personally discussed with the patient.  Please see medical record for radiology report.  ____________________________________________   PROCEDURES  Procedure(s) performed:  Procedures    Critical Care performed: no ____________________________________________   INITIAL IMPRESSION / ASSESSMENT AND PLAN / ED COURSE  Pertinent labs & imaging results that were available during my care of the patient were reviewed by me and considered in my medical decision making (see chart for details).  DDX: dvt, ecchymosis, aneurysm  Alexa Garcia is a 51 y.o. who presents to the ED with concern for blood clot in left arm. Ultrasound shows no evidence of blood clot. She has good distal perfusion. Numbness and tingling isn't known and is being followed up with her orthopedic surgeon. Patient stable for follow-up as an outpatient      ____________________________________________   FINAL CLINICAL IMPRESSION(S) / ED DIAGNOSES  Final diagnoses:  Numbness and tingling of left arm and leg      NEW MEDICATIONS STARTED DURING THIS VISIT:  Discharge Medication List as of 08/19/2016 10:58 AM       Note:  This document was prepared using Dragon voice recognition software and may include unintentional dictation errors.     Merlyn Lot, MD 08/19/16 [phone]

## 2016-08-19 NOTE — ED Notes (Signed)
Patient transported to Ultrasound 

## 2016-08-26 DIAGNOSIS — Z8781 Personal history of (healed) traumatic fracture: Secondary | ICD-10-CM | POA: Diagnosis not present

## 2016-08-26 DIAGNOSIS — Z967 Presence of other bone and tendon implants: Secondary | ICD-10-CM | POA: Diagnosis not present

## 2016-08-26 DIAGNOSIS — G5602 Carpal tunnel syndrome, left upper limb: Secondary | ICD-10-CM | POA: Diagnosis not present

## 2016-10-02 DIAGNOSIS — G5602 Carpal tunnel syndrome, left upper limb: Secondary | ICD-10-CM | POA: Diagnosis not present

## 2016-10-08 ENCOUNTER — Ambulatory Visit: Payer: Commercial Managed Care - HMO | Admitting: Anesthesiology

## 2016-10-08 ENCOUNTER — Encounter
Admission: RE | Disposition: A | Discharge status: Home / Self Care | Payer: Self-pay | Source: Ambulatory Visit | Attending: Orthopedic Surgery

## 2016-10-08 ENCOUNTER — Encounter: Payer: Self-pay | Admitting: *Deleted

## 2016-10-08 ENCOUNTER — Ambulatory Visit
Admission: RE | Admit: 2016-10-08 | Discharge: 2016-10-08 | Disposition: A | Discharge status: Home / Self Care | Payer: Commercial Managed Care - HMO | Source: Ambulatory Visit | Attending: Orthopedic Surgery | Admitting: Orthopedic Surgery

## 2016-10-08 DIAGNOSIS — T8484XA Pain due to internal orthopedic prosthetic devices, implants and grafts, initial encounter: Secondary | ICD-10-CM | POA: Diagnosis not present

## 2016-10-08 DIAGNOSIS — G5602 Carpal tunnel syndrome, left upper limb: Secondary | ICD-10-CM | POA: Insufficient documentation

## 2016-10-08 DIAGNOSIS — Y831 Surgical operation with implant of artificial internal device as the cause of abnormal reaction of the patient, or of later complication, without mention of misadventure at the time of the procedure: Secondary | ICD-10-CM | POA: Insufficient documentation

## 2016-10-08 DIAGNOSIS — Z79899 Other long term (current) drug therapy: Secondary | ICD-10-CM | POA: Insufficient documentation

## 2016-10-08 DIAGNOSIS — E785 Hyperlipidemia, unspecified: Secondary | ICD-10-CM | POA: Insufficient documentation

## 2016-10-08 DIAGNOSIS — Y929 Unspecified place or not applicable: Secondary | ICD-10-CM | POA: Diagnosis not present

## 2016-10-08 HISTORY — PX: HARDWARE REMOVAL: SHX979

## 2016-10-08 HISTORY — PX: CARPAL TUNNEL RELEASE: SHX101

## 2016-10-08 SURGERY — CARPAL TUNNEL RELEASE
Anesthesia: General | Site: Wrist | Laterality: Left | Wound class: Clean

## 2016-10-08 MED ORDER — HYDROMORPHONE HCL 1 MG/ML IJ SOLN
INTRAMUSCULAR | Status: AC
  Administered 2016-10-08: 0.25 mg via INTRAVENOUS
  Filled 2016-10-08: qty 1

## 2016-10-08 MED ORDER — KETOROLAC TROMETHAMINE 15 MG/ML IJ SOLN
INTRAMUSCULAR | Status: DC | PRN
  Administered 2016-10-08: 30 mg via INTRAVENOUS

## 2016-10-08 MED ORDER — FAMOTIDINE 20 MG PO TABS
ORAL_TABLET | ORAL | Status: AC
  Administered 2016-10-08: 20 mg via ORAL
  Filled 2016-10-08: qty 1

## 2016-10-08 MED ORDER — SCOPOLAMINE 1 MG/3DAYS TD PT72
MEDICATED_PATCH | TRANSDERMAL | Status: AC
  Administered 2016-10-08: 1.5 mg via TRANSDERMAL
  Filled 2016-10-08: qty 1

## 2016-10-08 MED ORDER — SODIUM CHLORIDE 0.9 % IV SOLN
INTRAVENOUS | Status: DC | PRN
  Administered 2016-10-08: 13:00:00 via INTRAVENOUS

## 2016-10-08 MED ORDER — ONDANSETRON HCL 4 MG/2ML IJ SOLN
INTRAMUSCULAR | Status: AC
  Filled 2016-10-08: qty 2

## 2016-10-08 MED ORDER — PROPOFOL 10 MG/ML IV BOLUS
INTRAVENOUS | Status: DC | PRN
  Administered 2016-10-08: 150 mg via INTRAVENOUS

## 2016-10-08 MED ORDER — FENTANYL CITRATE (PF) 100 MCG/2ML IJ SOLN
25.0000 ug | INTRAMUSCULAR | Status: AC | PRN
  Administered 2016-10-08 (×6): 25 ug via INTRAVENOUS

## 2016-10-08 MED ORDER — HYDROCODONE-ACETAMINOPHEN 5-325 MG PO TABS: 1.0000 | ORAL_TABLET | ORAL | 0 refills | Status: DC | PRN

## 2016-10-08 MED ORDER — FAMOTIDINE 20 MG PO TABS
20.0000 mg | ORAL_TABLET | Freq: Once | ORAL | Status: AC
  Administered 2016-10-08: 20 mg via ORAL

## 2016-10-08 MED ORDER — FENTANYL CITRATE (PF) 100 MCG/2ML IJ SOLN
INTRAMUSCULAR | Status: DC | PRN
  Administered 2016-10-08: 100 ug via INTRAVENOUS

## 2016-10-08 MED ORDER — FENTANYL CITRATE (PF) 100 MCG/2ML IJ SOLN
INTRAMUSCULAR | Status: AC
  Filled 2016-10-08: qty 2

## 2016-10-08 MED ORDER — CEFAZOLIN SODIUM-DEXTROSE 2-3 GM-% IV SOLR
INTRAVENOUS | Status: DC | PRN
  Administered 2016-10-08: 2 g via INTRAVENOUS

## 2016-10-08 MED ORDER — LACTATED RINGERS IV SOLN
INTRAVENOUS | Status: DC
  Administered 2016-10-08: 12:00:00 via INTRAVENOUS

## 2016-10-08 MED ORDER — HYDROMORPHONE HCL 1 MG/ML IJ SOLN
0.2500 mg | INTRAMUSCULAR | Status: DC | PRN
  Administered 2016-10-08 (×2): 0.25 mg via INTRAVENOUS

## 2016-10-08 MED ORDER — CEFAZOLIN SODIUM 1 G IJ SOLR
INTRAMUSCULAR | Status: AC
  Filled 2016-10-08: qty 20

## 2016-10-08 MED ORDER — DEXAMETHASONE SODIUM PHOSPHATE 4 MG/ML IJ SOLN
INTRAMUSCULAR | Status: DC | PRN
  Administered 2016-10-08: 10 mg via INTRAVENOUS

## 2016-10-08 MED ORDER — KETOROLAC TROMETHAMINE 30 MG/ML IJ SOLN
INTRAMUSCULAR | Status: AC
  Filled 2016-10-08: qty 1

## 2016-10-08 MED ORDER — SCOPOLAMINE 1 MG/3DAYS TD PT72
1.0000 | MEDICATED_PATCH | TRANSDERMAL | Status: DC
  Administered 2016-10-08: 1.5 mg via TRANSDERMAL

## 2016-10-08 MED ORDER — FENTANYL CITRATE (PF) 100 MCG/2ML IJ SOLN
INTRAMUSCULAR | Status: AC
  Administered 2016-10-08: 25 ug via INTRAVENOUS
  Filled 2016-10-08: qty 2

## 2016-10-08 MED ORDER — LIDOCAINE HCL (PF) 2 % IJ SOLN
INTRAMUSCULAR | Status: AC
  Filled 2016-10-08: qty 2

## 2016-10-08 MED ORDER — BUPIVACAINE HCL (PF) 0.5 % IJ SOLN
INTRAMUSCULAR | Status: AC
  Filled 2016-10-08: qty 30

## 2016-10-08 MED ORDER — PROPOFOL 10 MG/ML IV BOLUS
INTRAVENOUS | Status: AC
  Filled 2016-10-08: qty 20

## 2016-10-08 MED ORDER — MIDAZOLAM HCL 2 MG/2ML IJ SOLN
INTRAMUSCULAR | Status: AC
  Filled 2016-10-08: qty 2

## 2016-10-08 MED ORDER — ONDANSETRON HCL 4 MG/2ML IJ SOLN: 4.0000 mg | Freq: Once | INTRAMUSCULAR | Status: DC | PRN

## 2016-10-08 MED ORDER — MIDAZOLAM HCL 2 MG/2ML IJ SOLN
INTRAMUSCULAR | Status: DC | PRN
  Administered 2016-10-08: 2 mg via INTRAVENOUS

## 2016-10-08 MED ORDER — ACETAMINOPHEN 10 MG/ML IV SOLN
INTRAVENOUS | Status: AC
Start: 2016-10-08 — End: 2016-10-08
  Filled 2016-10-08: qty 100

## 2016-10-08 MED ORDER — ONDANSETRON HCL 4 MG/2ML IJ SOLN
INTRAMUSCULAR | Status: DC | PRN
  Administered 2016-10-08 (×2): 4 mg via INTRAVENOUS

## 2016-10-08 MED ORDER — BUPIVACAINE HCL (PF) 0.5 % IJ SOLN
INTRAMUSCULAR | Status: DC | PRN
  Administered 2016-10-08: 10 mL

## 2016-10-08 MED ORDER — ACETAMINOPHEN 10 MG/ML IV SOLN
INTRAVENOUS | Status: DC | PRN
  Administered 2016-10-08: 1000 mg via INTRAVENOUS

## 2016-10-08 SURGICAL SUPPLY — 53 items
BANDAGE ACE 3X5.8 VEL STRL LF (GAUZE/BANDAGES/DRESSINGS) ×4 IMPLANT
BNDG COHESIVE 4X5 TAN STRL (GAUZE/BANDAGES/DRESSINGS) ×4 IMPLANT
CANISTER SUCT 1200ML W/VALVE (MISCELLANEOUS) ×4 IMPLANT
CHLORAPREP W/TINT 26ML (MISCELLANEOUS) ×4 IMPLANT
CUFF TOURN 18 STER (MISCELLANEOUS) ×4 IMPLANT
CUFF TOURN 24 STER (MISCELLANEOUS) IMPLANT
DRAPE C-ARM XRAY 36X54 (DRAPES) ×4 IMPLANT
DRAPE INCISE IOBAN 66X45 STRL (DRAPES) ×4 IMPLANT
DRSG EMULSION OIL 3X8 NADH (GAUZE/BANDAGES/DRESSINGS) ×4 IMPLANT
ELECT CAUTERY BLADE 6.4 (BLADE) ×4 IMPLANT
ELECT CAUTERY NEEDLE 2.0 MIC (NEEDLE) IMPLANT
ELECT REM PT RETURN 9FT ADLT (ELECTROSURGICAL) ×4
ELECTRODE REM PT RTRN 9FT ADLT (ELECTROSURGICAL) ×2 IMPLANT
GAUZE PETRO XEROFOAM 1X8 (MISCELLANEOUS) ×4 IMPLANT
GAUZE SPONGE 4X4 12PLY STRL (GAUZE/BANDAGES/DRESSINGS) ×4 IMPLANT
GLOVE BIO SURGEON STRL SZ7 (GLOVE) ×8 IMPLANT
GLOVE BIOGEL PI IND STRL 9 (GLOVE) ×2 IMPLANT
GLOVE BIOGEL PI INDICATOR 9 (GLOVE) ×2
GLOVE INDICATOR 7.5 STRL GRN (GLOVE) ×8 IMPLANT
GLOVE SURG SYN 9.0  PF PI (GLOVE) ×2
GLOVE SURG SYN 9.0 PF PI (GLOVE) ×2 IMPLANT
GOWN SRG 2XL LVL 4 RGLN SLV (GOWNS) ×2 IMPLANT
GOWN STRL NON-REIN 2XL LVL4 (GOWNS) ×2
GOWN STRL REUS W/ TWL LRG LVL3 (GOWN DISPOSABLE) ×2 IMPLANT
GOWN STRL REUS W/TWL LRG LVL3 (GOWN DISPOSABLE) ×2
GOWN STRL REUS W/TWL XL LVL4 (GOWN DISPOSABLE) ×4 IMPLANT
KIT RM TURNOVER STRD PROC AR (KITS) ×4 IMPLANT
NEEDLE FILTER BLUNT 18X 1/2SAF (NEEDLE) ×2
NEEDLE FILTER BLUNT 18X1 1/2 (NEEDLE) ×2 IMPLANT
NS IRRIG 1000ML POUR BTL (IV SOLUTION) ×4 IMPLANT
NS IRRIG 500ML POUR BTL (IV SOLUTION) ×4 IMPLANT
PACK EXTREMITY ARMC (MISCELLANEOUS) ×4 IMPLANT
PACK HIP COMPR (MISCELLANEOUS) IMPLANT
PAD ABD DERMACEA PRESS 5X9 (GAUZE/BANDAGES/DRESSINGS) IMPLANT
PAD CAST CTTN 4X4 STRL (SOFTGOODS) ×2 IMPLANT
PADDING CAST COTTON 4X4 STRL (SOFTGOODS) ×2
PREP PVP WINGED SPONGE (MISCELLANEOUS) ×4 IMPLANT
STAPLER SKIN PROX 35W (STAPLE) ×4 IMPLANT
STOCKINETTE M/LG 89821 (MISCELLANEOUS) ×4 IMPLANT
SUT ETHIBOND NAB CT1 #1 30IN (SUTURE) IMPLANT
SUT ETHILON 3-0 FS-10 30 BLK (SUTURE)
SUT ETHILON 4-0 (SUTURE) ×4
SUT ETHILON 4-0 FS2 18XMFL BLK (SUTURE) ×4
SUT VIC AB 0 CT1 36 (SUTURE) IMPLANT
SUT VIC AB 1 CTX 27 (SUTURE) IMPLANT
SUT VIC AB 2-0 CT1 27 (SUTURE) ×2
SUT VIC AB 2-0 CT1 TAPERPNT 27 (SUTURE) ×2 IMPLANT
SUT VIC AB 3-0 SH 27 (SUTURE) ×2
SUT VIC AB 3-0 SH 27X BRD (SUTURE) ×2 IMPLANT
SUTURE EHLN 3-0 FS-10 30 BLK (SUTURE) IMPLANT
SUTURE ETHLN 4-0 FS2 18XMF BLK (SUTURE) ×4 IMPLANT
SYRINGE 10CC LL (SYRINGE) ×4 IMPLANT
WATER STERILE IRR 1000ML POUR (IV SOLUTION) IMPLANT

## 2016-10-08 NOTE — Anesthesia Post-op Follow-up Note (Cosign Needed)
Anesthesia QCDR form completed.        

## 2016-10-08 NOTE — Op Note (Signed)
10/08/2016  2:21 PM  PATIENT:  Alexa Garcia  51 y.o. female  PRE-OPERATIVE DIAGNOSIS:  LEFT CARPAL TUNNEL SYNDROME, painful hardware  POST-OPERATIVE DIAGNOSIS:  LEFT CARPAL TUNNEL SYNDROME, painful hardware  PROCEDURE:  Procedure(s): CARPAL TUNNEL RELEASE (Left) HARDWARE REMOVAL (Left)  SURGEON: Laurene Footman, MD  ASSISTANTS: None  ANESTHESIA:   general  EBL:  No intake/output data recorded.  BLOOD ADMINISTERED:none  DRAINS: none   LOCAL MEDICATIONS USED:  MARCAINE     SPECIMEN:  No Specimen  DISPOSITION OF SPECIMEN:  N/A  COUNTS:  YES  TOURNIQUET:   27 minutes at 250 mmHg  IMPLANTS: None  DICTATION: .Dragon Dictation patient brought the operating room and after adequate anesthesia was obtained left arm was prepped and draped in sterile fashion. After patient identification and timeout procedures were completed, tourniquet was raised and the prior incision was utilized on the volar aspect of the distal forearm. Skin and subcutaneous tissue was incised and the FCR tendon was identified and tendon sheath incised with the tendon retracted radially. The deep muscle plane was identified and the plate exposed without difficulty with all screws and pegs being removed again without difficulty. The plate was then removed and the fracture was healed. Next attention was turned to the carpal tunnel with approximately 2 cm incision made in line with ring metacarpal and with skin subcutaneous tissues tissue incised the transverse carpal ligament was identified. Small incision of this was made and then the fascial hemostat was placed deep to protect the underlying structures release was then carried out proximal and distal to there is fat noted around the nerve distally proximally there was released proximal to the wrist flexion crease at which point there appeared to be a good vascular blush and relief of pressure that appeared to be just proximal to the wrist flexion crease. The incision  was then injected with 10 cc half percent Sensorcaine followed by thorough irrigation of both wounds. Wounds were closed with 3-0 Vicryl on the volar forearm incision followed by simple interrupted 5-0 nylon with 5-0 nylon for the carpal tunnel incision with a simple interrupted fashion. Xeroform 4 x 4's web roll and Ace wrap applied tourniquet was let down to close the case  PLAN OF CARE: Discharge to home after PACU  PATIENT DISPOSITION:  PACU - hemodynamically stable.

## 2016-10-08 NOTE — Transfer of Care (Signed)
Immediate Anesthesia Transfer of Care Note  Patient: Alexa Garcia  Procedure(s) Performed: Procedure(s): CARPAL TUNNEL RELEASE (Left) HARDWARE REMOVAL (Left)  Patient Location: PACU  Anesthesia Type:General  Level of Consciousness: awake and sedated  Airway & Oxygen Therapy: Patient Spontanous Breathing and Patient connected to face mask oxygen  Post-op Assessment: Report given to RN  Post vital signs: Reviewed and stable  Last Vitals:  Vitals:   10/08/16 1147  BP: 123/63  Pulse: 63  Resp: 18  Temp: (!) 35.5 C    Last Pain:  Vitals:   10/08/16 1147  TempSrc: Tympanic  PainSc: 9       Patients Stated Pain Goal: 2 (71/21/97 5883)  Complications: No apparent anesthesia complications

## 2016-10-08 NOTE — OR Nursing (Signed)
Explanted one plate, five pegs, and four cortical screws removed from left wrist.

## 2016-10-08 NOTE — Anesthesia Procedure Notes (Signed)
Procedure Name: LMA Insertion Date/Time: 10/08/2016 1:44 PM Performed by: Nelda Marseille Pre-anesthesia Checklist: Patient identified, Patient being monitored, Timeout performed, Emergency Drugs available and Suction available Patient Re-evaluated:Patient Re-evaluated prior to induction Oxygen Delivery Method: Circle system utilized Preoxygenation: Pre-oxygenation with 100% oxygen Induction Type: IV induction Ventilation: Mask ventilation without difficulty LMA: LMA inserted LMA Size: 3.5 Tube type: Oral Number of attempts: 1 Placement Confirmation: positive ETCO2 and breath sounds checked- equal and bilateral Tube secured with: Tape Dental Injury: Teeth and Oropharynx as per pre-operative assessment

## 2016-10-08 NOTE — H&P (Signed)
  Chief Complaint  Patient presents with  . Follow-up  L Distal Radius ORIF 04/04/16.   History of the Present Illness:  Alexa Garcia is a 51 y.o. female here for follow-up of open reduction internal fixation of left distal radius fracture with DRUJ dislocation. The date of surgery was 04/04/2016. The patient went to the emergency room recently because of swelling. An ultrasound was performed and was negative for a blood clot.   She reports that she is having numbness in her left wrist that wakes her at night. She has occasional numbness in her fingers. She had a EMG nerve conduction test done recently that showed severe acute carpal tunnel syndrome.   I have reviewed past medical, surgical, social and family history, and allergies as documented in the EMR.  Past Medical History: Past Medical History:  Diagnosis Date  . History of ovarian cyst  . Hyperlipidemia, unspecified  . Migraine  . Tubular adenoma 02/14/2015   Past Surgical History: Past Surgical History:  Procedure Laterality Date  . APPENDECTOMY  . bladder polyps removed  . COLONOSCOPY 02/14/2015  Tubular adenoma/Repeat 57yrs/MUS  . de Quervain's release Right 06/09/2002  . Hammertoe surgery 1992  done in [place]  . HYSTERECTOMY  TAH  . OOPHORECTOMY Right  age 68 for ovarian cyst  . OPEN REDUCTION INTERNAL FIXATION (ORIF) DISTAL RADIAL FRACTURE, CLOSED REDUCTION PINNING OF DISTAL RADIAL ULNAR JOINT (Left) Left 04/04/2016  Dr. Rudene Christians  . right foot hallux valgus corrective surgery   Past Family History: Family History  Problem Relation Age of Onset  . Cancer Mother  . Myocardial Infarction (Heart attack) Father  . Coronary Artery Disease (Blocked arteries around heart) Father  . Stroke Father  . Diabetes Father   Medications: Current Outpatient Prescriptions Ordered in Epic  Medication Sig Dispense Refill  . fluticasone (FLONASE) 50 mcg/actuation nasal spray Place 2 sprays into both nostrils once daily.   No  current Epic-ordered facility-administered medications on file.   Allergies: No Known Allergies   Body mass index is 31.93 kg/m.  Review of Systems: A comprehensive 14 point ROS was performed, reviewed, and the pertinent orthopaedic findings are documented in the HPI.  Vitals:  08/26/16 0824  BP: 110/72   General Physical Examination:  General/Constitutional: No apparent distress: well-nourished and well developed. Eyes: Pupils equal, round with synchronous movement. Lungs: Clear to auscultation HEENT: Normal Vascular: No edema, swelling or tenderness, except as noted in detailed exam. Cardiac: Heart rate and rhythm is regular. Integumentary: No impressive skin lesions present, except as noted in detailed exam. Neuro/Psych: Normal mood and affect, oriented to person, place and time.  Musculoskeletal Examination: On exam of the left wrist and hand, she has intact sensation to pinwheel testing. positive Tinel's at the proximal carpal tunnel. No thenar atrophy   Radiographs: No new imaging studies were obtained or reviewed today.  Assessment: ICD-10-CM ICD-9-CM  1. S/P ORIF (open reduction internal fixation) fracture Z96.7 V45.89  Z87.81 V15.51  2. Left carpal tunnel syndrome G56.02 354.0   Plan: With severe acute carpal tunnel syndrome carpal tunnel release is indicated. We can also remove the plate at the same time and hope she can resume work the following day

## 2016-10-08 NOTE — Anesthesia Preprocedure Evaluation (Signed)
Anesthesia Evaluation  Patient identified by MRN, date of birth, ID band Patient awake    Reviewed: Allergy & Precautions, NPO status , Patient's Chart, lab work & pertinent test results, reviewed documented beta blocker date and time   History of Anesthesia Complications (+) history of anesthetic complications  Airway Mallampati: II  TM Distance: >3 FB     Dental  (+) Chipped   Pulmonary former smoker,           Cardiovascular      Neuro/Psych  Headaches, Anxiety    GI/Hepatic GERD  Controlled,  Endo/Other    Renal/GU      Musculoskeletal  (+) Arthritis ,   Abdominal   Peds  Hematology   Anesthesia Other Findings   Reproductive/Obstetrics                             Anesthesia Physical Anesthesia Plan  ASA: II  Anesthesia Plan: General   Post-op Pain Management:    Induction: Intravenous  PONV Risk Score and Plan:   Airway Management Planned: LMA  Additional Equipment:   Intra-op Plan:   Post-operative Plan:   Informed Consent: I have reviewed the patients History and Physical, chart, labs and discussed the procedure including the risks, benefits and alternatives for the proposed anesthesia with the patient or authorized representative who has indicated his/her understanding and acceptance.     Plan Discussed with: CRNA  Anesthesia Plan Comments:         Anesthesia Quick Evaluation

## 2016-10-08 NOTE — Discharge Instructions (Addendum)
Work on finger motion is much as possible. If fingers swell loosen Ace wrap but leave underlying cast padding in place. Pain medicine as directed.  AMBULATORY SURGERY  DISCHARGE INSTRUCTIONS   1) The drugs that you were given will stay in your system until tomorrow so for the next 24 hours you should not:  A) Drive an automobile B) Make any legal decisions C) Drink any alcoholic beverage   2) You may resume regular meals tomorrow.  Today it is better to start with liquids and gradually work up to solid foods.  You may eat anything you prefer, but it is better to start with liquids, then soup and crackers, and gradually work up to solid foods.   3) Please notify your doctor immediately if you have any unusual bleeding, trouble breathing, redness and pain at the surgery site, drainage, fever, or pain not relieved by medication.    4) Additional Instructions:   Please contact your physician with any problems or Same Day Surgery at [phone], Monday through Friday 6 am to 4 pm, or [place] at [facility] number at [phone].

## 2016-10-09 NOTE — Anesthesia Postprocedure Evaluation (Signed)
Anesthesia Post Note  Patient: Alexa Garcia  Procedure(s) Performed: Procedure(s) (LRB): CARPAL TUNNEL RELEASE (Left) HARDWARE REMOVAL (Left)  Patient location during evaluation: PACU Anesthesia Type: General Level of consciousness: awake and alert Pain management: pain level controlled Vital Signs Assessment: post-procedure vital signs reviewed and stable Respiratory status: spontaneous breathing, nonlabored ventilation, respiratory function stable and patient connected to nasal cannula oxygen Cardiovascular status: blood pressure returned to baseline and stable Postop Assessment: no signs of nausea or vomiting Anesthetic complications: no     Last Vitals:  Vitals:   10/08/16 1550 10/08/16 1628  BP: 121/66 128/67  Pulse: 63 70  Resp: 16 16  Temp: 36.5 C     Last Pain:  Vitals:   10/08/16 1628  TempSrc:   PainSc: [place]

## 2016-10-11 ENCOUNTER — Encounter: Payer: Self-pay | Admitting: Orthopedic Surgery

## 2016-10-11 DIAGNOSIS — G5602 Carpal tunnel syndrome, left upper limb: Secondary | ICD-10-CM | POA: Diagnosis not present

## 2017-02-03 DIAGNOSIS — M545 Low back pain: Secondary | ICD-10-CM | POA: Diagnosis not present

## 2017-02-03 DIAGNOSIS — M5136 Other intervertebral disc degeneration, lumbar region: Secondary | ICD-10-CM | POA: Diagnosis not present

## 2017-02-11 DIAGNOSIS — M545 Low back pain: Secondary | ICD-10-CM | POA: Diagnosis not present

## 2017-02-11 DIAGNOSIS — M6281 Muscle weakness (generalized): Secondary | ICD-10-CM | POA: Diagnosis not present

## 2017-04-18 DIAGNOSIS — J029 Acute pharyngitis, unspecified: Secondary | ICD-10-CM | POA: Diagnosis not present

## 2017-07-08 ENCOUNTER — Other Ambulatory Visit: Payer: Self-pay | Admitting: Obstetrics and Gynecology

## 2017-07-08 DIAGNOSIS — Z1231 Encounter for screening mammogram for malignant neoplasm of breast: Secondary | ICD-10-CM

## 2017-07-21 ENCOUNTER — Encounter
Admission: RE | Admit: 2017-07-21 | Discharge: 2017-07-21 | Disposition: A | Discharge status: Home / Self Care | Payer: Commercial Managed Care - HMO | Source: Ambulatory Visit | Attending: Orthopedic Surgery | Admitting: Orthopedic Surgery

## 2017-07-21 ENCOUNTER — Other Ambulatory Visit: Payer: Self-pay

## 2017-07-21 HISTORY — DX: Nausea with vomiting, unspecified: R11.2

## 2017-07-21 HISTORY — DX: Other specified postprocedural states: Z98.890

## 2017-07-21 NOTE — Patient Instructions (Signed)
Your procedure is scheduled on: 07-24-17 Report to Same Day Surgery 2nd floor medical mall [facility] Entrance-take elevator on left to 2nd floor.  Check in with surgery information desk.) To find out your arrival time please call [phone] between 1PM - 3PM on 07-23-17  Remember: Instructions that are not followed completely may result in serious medical risk, up to and including death, or upon the discretion of your surgeon and anesthesiologist your surgery may need to be rescheduled.    _x___ 1. Do not eat food after midnight the night before your procedure. NO GUM OR CANDY AFTER MIDNIGHT.  You may drink clear liquids up to 2 hours before you are scheduled to arrive at the hospital for your procedure.  Do not drink clear liquids within 2 hours of your scheduled arrival to the hospital.  Clear liquids include  --Water or Apple juice without pulp  --Clear carbohydrate beverage such as ClearFast or Gatorade  --Black Coffee or Clear Tea (No milk, no creamers, do not add anything to the coffee or Tea     __x__ 2. No Alcohol for 24 hours before or after surgery.   __x__3. No Smoking or e-cigarettes for 24 prior to surgery.  Do not use any chewable tobacco products for at least 6 hour prior to surgery   ____  4. Bring all medications with you on the day of surgery if instructed.    __x__ 5. Notify your doctor if there is any change in your medical condition     (cold, fever, infections).    x___6. On the morning of surgery brush your teeth with toothpaste and water.  You may rinse your mouth with mouth wash if you wish.  Do not swallow any toothpaste or mouthwash.   Do not wear jewelry, make-up, hairpins, clips or nail polish.  Do not wear lotions, powders, or perfumes. You may wear deodorant.  Do not shave 48 hours prior to surgery. Men may shave face and neck.  Do not bring valuables to the hospital.    [facility] is not responsible for any belongings or valuables.    Contacts, dentures or bridgework may not be worn into surgery.  Leave your suitcase in the car. After surgery it may be brought to your room.  For patients admitted to the hospital, discharge time is determined by your treatment team.  _  Patients discharged the day of surgery will not be allowed to drive home.  You will need someone to drive you home and stay with you the night of your procedure.   ____ Take anti-hypertensive listed below, cardiac, seizure, asthma, anti-reflux and psychiatric medicines. These include:  1. NONE   2.  3.  4.  5.  6.  ____Fleets enema or Magnesium Citrate as directed.   ____ Use CHG Soap or sage wipes as directed on instruction sheet   ____ Use inhalers on the day of surgery and bring to hospital day of surgery  ____ Stop Metformin and Janumet 2 days prior to surgery.    ____ Take 1/2 of usual insulin dose the night before surgery and none on the morning  surgery.   ____ Follow recommendations from Cardiologist, Pulmonologist or PCP regarding  stopping Aspirin, Coumadin, Plavix ,Eliquis, Effient, or Pradaxa, and Pletal.  X____Stop Anti-inflammatories such as Advil, Aleve, Ibuprofen, Motrin, Naproxen, Naprosyn, Goodies powders or aspirin products NOW-OK to take Tylenol    ____ Stop supplements until after surgery.    ____ Bring C-Pap to the hospital.

## 2017-07-24 ENCOUNTER — Other Ambulatory Visit: Payer: Self-pay

## 2017-07-24 ENCOUNTER — Ambulatory Visit: Payer: Commercial Managed Care - HMO | Admitting: Certified Registered Nurse Anesthetist

## 2017-07-24 ENCOUNTER — Encounter
Admission: RE | Disposition: A | Discharge status: Home / Self Care | Payer: Self-pay | Source: Ambulatory Visit | Attending: Orthopedic Surgery

## 2017-07-24 ENCOUNTER — Ambulatory Visit
Admission: RE | Admit: 2017-07-24 | Discharge: 2017-07-24 | Disposition: A | Discharge status: Home / Self Care | Payer: Commercial Managed Care - HMO | Source: Ambulatory Visit | Attending: Orthopedic Surgery | Admitting: Orthopedic Surgery

## 2017-07-24 ENCOUNTER — Encounter: Payer: Self-pay | Admitting: Certified Registered Nurse Anesthetist

## 2017-07-24 DIAGNOSIS — E785 Hyperlipidemia, unspecified: Secondary | ICD-10-CM | POA: Insufficient documentation

## 2017-07-24 DIAGNOSIS — K219 Gastro-esophageal reflux disease without esophagitis: Secondary | ICD-10-CM | POA: Diagnosis not present

## 2017-07-24 DIAGNOSIS — Z87891 Personal history of nicotine dependence: Secondary | ICD-10-CM | POA: Insufficient documentation

## 2017-07-24 DIAGNOSIS — G5601 Carpal tunnel syndrome, right upper limb: Secondary | ICD-10-CM | POA: Insufficient documentation

## 2017-07-24 DIAGNOSIS — Z79899 Other long term (current) drug therapy: Secondary | ICD-10-CM | POA: Insufficient documentation

## 2017-07-24 HISTORY — PX: CARPAL TUNNEL RELEASE: SHX101

## 2017-07-24 SURGERY — CARPAL TUNNEL RELEASE
Anesthesia: General | Site: Wrist | Laterality: Right | Wound class: Clean

## 2017-07-24 MED ORDER — LIDOCAINE HCL (PF) 2 % IJ SOLN
INTRAMUSCULAR | Status: AC
  Filled 2017-07-24: qty 10

## 2017-07-24 MED ORDER — PROPOFOL 10 MG/ML IV BOLUS
INTRAVENOUS | Status: DC | PRN
  Administered 2017-07-24: 200 mg via INTRAVENOUS

## 2017-07-24 MED ORDER — FENTANYL CITRATE (PF) 100 MCG/2ML IJ SOLN: 25.0000 ug | INTRAMUSCULAR | Status: DC | PRN

## 2017-07-24 MED ORDER — MIDAZOLAM HCL 2 MG/2ML IJ SOLN
INTRAMUSCULAR | Status: AC
  Filled 2017-07-24: qty 2

## 2017-07-24 MED ORDER — DEXAMETHASONE SODIUM PHOSPHATE 10 MG/ML IJ SOLN
INTRAMUSCULAR | Status: DC | PRN
  Administered 2017-07-24: 10 mg via INTRAVENOUS

## 2017-07-24 MED ORDER — HYDROCODONE-ACETAMINOPHEN 5-325 MG PO TABS: 1.0000 | ORAL_TABLET | Freq: Four times a day (QID) | ORAL | 0 refills | Status: DC | PRN

## 2017-07-24 MED ORDER — BUPIVACAINE HCL (PF) 0.5 % IJ SOLN
INTRAMUSCULAR | Status: DC | PRN
  Administered 2017-07-24: 10 mL

## 2017-07-24 MED ORDER — FENTANYL CITRATE (PF) 100 MCG/2ML IJ SOLN
INTRAMUSCULAR | Status: DC | PRN
  Administered 2017-07-24 (×2): 25 ug via INTRAVENOUS
  Administered 2017-07-24: 50 ug via INTRAVENOUS

## 2017-07-24 MED ORDER — ONDANSETRON HCL 4 MG/2ML IJ SOLN
INTRAMUSCULAR | Status: DC | PRN
  Administered 2017-07-24: 4 mg via INTRAVENOUS

## 2017-07-24 MED ORDER — MIDAZOLAM HCL 2 MG/2ML IJ SOLN
INTRAMUSCULAR | Status: DC | PRN
  Administered 2017-07-24: 2 mg via INTRAVENOUS

## 2017-07-24 MED ORDER — DEXAMETHASONE SODIUM PHOSPHATE 10 MG/ML IJ SOLN
INTRAMUSCULAR | Status: AC
  Filled 2017-07-24: qty 1

## 2017-07-24 MED ORDER — FAMOTIDINE 20 MG PO TABS: 20.0000 mg | ORAL_TABLET | Freq: Once | ORAL | Status: DC

## 2017-07-24 MED ORDER — ONDANSETRON HCL 4 MG/2ML IJ SOLN: 4.0000 mg | Freq: Once | INTRAMUSCULAR | Status: DC | PRN

## 2017-07-24 MED ORDER — LIDOCAINE HCL (CARDIAC) PF 100 MG/5ML IV SOSY
PREFILLED_SYRINGE | INTRAVENOUS | Status: DC | PRN
  Administered 2017-07-24: 100 mg via INTRAVENOUS

## 2017-07-24 MED ORDER — PROPOFOL 10 MG/ML IV BOLUS
INTRAVENOUS | Status: AC
  Filled 2017-07-24: qty 20

## 2017-07-24 MED ORDER — FAMOTIDINE 20 MG PO TABS
ORAL_TABLET | ORAL | Status: AC
  Administered 2017-07-24: 20 mg
  Filled 2017-07-24: qty 1

## 2017-07-24 MED ORDER — ONDANSETRON HCL 4 MG/2ML IJ SOLN
INTRAMUSCULAR | Status: AC
  Filled 2017-07-24: qty 2

## 2017-07-24 MED ORDER — FENTANYL CITRATE (PF) 100 MCG/2ML IJ SOLN
INTRAMUSCULAR | Status: AC
  Filled 2017-07-24: qty 2

## 2017-07-24 MED ORDER — BUPIVACAINE HCL (PF) 0.5 % IJ SOLN
INTRAMUSCULAR | Status: AC
  Filled 2017-07-24: qty 30

## 2017-07-24 MED ORDER — LACTATED RINGERS IV SOLN
INTRAVENOUS | Status: DC
  Administered 2017-07-24: 14:00:00 via INTRAVENOUS

## 2017-07-24 MED ORDER — SCOPOLAMINE 1 MG/3DAYS TD PT72
MEDICATED_PATCH | TRANSDERMAL | Status: AC
  Administered 2017-07-24: 1.5 mg
  Filled 2017-07-24: qty 1

## 2017-07-24 SURGICAL SUPPLY — 24 items
BANDAGE ACE 3X5.8 VEL STRL LF (GAUZE/BANDAGES/DRESSINGS) ×3 IMPLANT
BANDAGE ACE 4X5 VEL STRL LF (GAUZE/BANDAGES/DRESSINGS) ×3 IMPLANT
CANISTER SUCT 1200ML W/VALVE (MISCELLANEOUS) ×3 IMPLANT
CHLORAPREP W/TINT 26ML (MISCELLANEOUS) ×6 IMPLANT
CUFF TOURN 18 STER (MISCELLANEOUS) ×3 IMPLANT
ELECT CAUTERY NEEDLE 2.0 MIC (NEEDLE) IMPLANT
GAUZE PETRO XEROFOAM 1X8 (MISCELLANEOUS) ×3 IMPLANT
GAUZE SPONGE 4X4 12PLY STRL (GAUZE/BANDAGES/DRESSINGS) ×3 IMPLANT
GLOVE SURG SYN 9.0  PF PI (GLOVE) ×2
GLOVE SURG SYN 9.0 PF PI (GLOVE) ×1 IMPLANT
GOWN SRG 2XL LVL 4 RGLN SLV (GOWNS) ×1 IMPLANT
GOWN STRL NON-REIN 2XL LVL4 (GOWNS) ×2
GOWN STRL REUS W/ TWL LRG LVL3 (GOWN DISPOSABLE) ×1 IMPLANT
GOWN STRL REUS W/TWL LRG LVL3 (GOWN DISPOSABLE) ×2
KIT TURNOVER KIT A (KITS) ×3 IMPLANT
NS IRRIG 500ML POUR BTL (IV SOLUTION) ×3 IMPLANT
PACK EXTREMITY ARMC (MISCELLANEOUS) ×3 IMPLANT
PAD CAST CTTN 4X4 STRL (SOFTGOODS) ×1 IMPLANT
PADDING CAST COTTON 4X4 STRL (SOFTGOODS) ×2
SCALPEL PROTECTED #15 DISP (BLADE) ×6 IMPLANT
STOCKINETTE STRL 4IN 9604848 (GAUZE/BANDAGES/DRESSINGS) ×3 IMPLANT
SUT ETHILON 4-0 (SUTURE) ×2
SUT ETHILON 4-0 FS2 18XMFL BLK (SUTURE) ×1
SUTURE ETHLN 4-0 FS2 18XMF BLK (SUTURE) ×1 IMPLANT

## 2017-07-24 NOTE — Op Note (Signed)
07/24/2017  3:20 PM  PATIENT:  Alexa Garcia  52 y.o. female  PRE-OPERATIVE DIAGNOSIS:  RIGHT CARPAL TUNNEL SYNDROME  POST-OPERATIVE DIAGNOSIS:  RIGHT CARPAL TUNNEL SYNDROME  PROCEDURE:  Procedure(s): CARPAL TUNNEL RELEASE (Right)  SURGEON: Laurene Footman, MD  ASSISTANTS: None  ANESTHESIA:   general  EBL:  Total I/O In: 300 [I.V.:300] Out: 0   BLOOD ADMINISTERED:none  DRAINS: none   LOCAL MEDICATIONS USED:  MARCAINE     SPECIMEN:  No Specimen  DISPOSITION OF SPECIMEN:  N/A  COUNTS:  YES  TOURNIQUET:   Total Tourniquet Time Documented: Upper Arm (Right) - 9 minutes Total: Upper Arm (Right) - 9 minutes   IMPLANTS: None  DICTATION: .Dragon Dictation patient was brought to the operating room and after adequate anesthesia was obtained the right arm was prepped and draped in sterile fashion.  After patient identification and timeout procedures were completed, tourniquet was raised and incision was made in line with the ring metacarpal approximately 2 cm in length.  Skin and subcutaneous tissue was spread and the transverse carpal ligament identified and opened with a fascial hemostat stent placed deep.  Going distally the ligament was quite tight up until the point where it was released and there is fat noted around the nerve.  Going proximally approximately centimeter proximal to the wrist flexion crease there is a tight band and after release of this there is good vascular blush to the nerve and it appeared that there been adequate decompression with no masses noted and just mild flexor tenosynovitis present.  The wound was irrigated and closed after infiltration of 10 cc of half percent Sensorcaine without epinephrine with interrupted 4-0 nylon followed by Xeroform 4 x 4's web roll and Ace wrap  PLAN OF CARE: Discharge to home after PACU  PATIENT DISPOSITION:  PACU - hemodynamically stable.

## 2017-07-24 NOTE — Anesthesia Preprocedure Evaluation (Signed)
Anesthesia Evaluation  Patient identified by MRN, date of birth, ID band Patient awake    Reviewed: Allergy & Precautions, NPO status   History of Anesthesia Complications (+) PONV  Airway Mallampati: III       Dental  (+) Teeth Intact   Pulmonary former smoker,    Pulmonary exam normal        Cardiovascular negative cardio ROS Normal cardiovascular exam     Neuro/Psych  Headaches,    GI/Hepatic Neg liver ROS, GERD  Medicated and Controlled,  Endo/Other  negative endocrine ROS  Renal/GU negative Renal ROS     Musculoskeletal  (+) Arthritis , Osteoarthritis,    Abdominal Normal abdominal exam  (+)   Peds  Hematology negative hematology ROS (+)   Anesthesia Other Findings   Reproductive/Obstetrics                             Anesthesia Physical Anesthesia Plan  ASA: II  Anesthesia Plan: General   Post-op Pain Management:    Induction: Intravenous  PONV Risk Score and Plan:   Airway Management Planned: LMA  Additional Equipment:   Intra-op Plan:   Post-operative Plan: Extubation in OR  Informed Consent: I have reviewed the patients History and Physical, chart, labs and discussed the procedure including the risks, benefits and alternatives for the proposed anesthesia with the patient or authorized representative who has indicated his/her understanding and acceptance.   Dental advisory given  Plan Discussed with:   Anesthesia Plan Comments:         Anesthesia Quick Evaluation

## 2017-07-24 NOTE — Discharge Instructions (Addendum)
Keep arm elevated as much as possible.  Work on finger range of motion.  Loosen Ace wrap prior to dismissal and the fingers well over the weekend but leave under applying cotton wrap alone.  Pain medicine as directed   AMBULATORY SURGERY  DISCHARGE INSTRUCTIONS   1) The drugs that you were given will stay in your system until tomorrow so for the next 24 hours you should not:  A) Drive an automobile B) Make any legal decisions C) Drink any alcoholic beverage   2) You may resume regular meals tomorrow.  Today it is better to start with liquids and gradually work up to solid foods.  You may eat anything you prefer, but it is better to start with liquids, then soup and crackers, and gradually work up to solid foods.   3) Please notify your doctor immediately if you have any unusual bleeding, trouble breathing, redness and pain at the surgery site, drainage, fever, or pain not relieved by medication.    4) Additional Instructions:        Please contact your physician with any problems or Same Day Surgery at [phone], Monday through Friday 6 am to 4 pm, or [place] at [facility] number at [phone].AMBULATORY SURGERY  DISCHARGE INSTRUCTIONS   5) The drugs that you were given will stay in your system until tomorrow so for the next 24 hours you should not:  D) Drive an automobile E) Make any legal decisions F) Drink any alcoholic beverage   6) You may resume regular meals tomorrow.  Today it is better to start with liquids and gradually work up to solid foods.  You may eat anything you prefer, but it is better to start with liquids, then soup and crackers, and gradually work up to solid foods.   7) Please notify your doctor immediately if you have any unusual bleeding, trouble breathing, redness and pain at the surgery site, drainage, fever, or pain not relieved by medication.    8) Additional Instructions:        Please contact your physician with any  problems or Same Day Surgery at [phone], Monday through Friday 6 am to 4 pm, or [place] at [facility] number at [phone].

## 2017-07-24 NOTE — Transfer of Care (Signed)
Immediate Anesthesia Transfer of Care Note  Patient: Alexa Garcia  Procedure(s) Performed: CARPAL TUNNEL RELEASE (Right Wrist)  Patient Location: PACU  Anesthesia Type:General  Level of Consciousness: awake and alert   Airway & Oxygen Therapy: Patient Spontanous Breathing and Patient connected to face mask oxygen  Post-op Assessment: Report given to RN and Post -op Vital signs reviewed and stable  Post vital signs: Reviewed and stable  Last Vitals:  Vitals Value Taken Time  BP 111/62 07/24/2017  3:22 PM  Temp 36.1 C 07/24/2017  3:23 PM  Pulse 64 07/24/2017  3:22 PM  Resp 13 07/24/2017  3:22 PM  SpO2 100 % 07/24/2017  3:22 PM  Vitals shown include unvalidated device data.  Last Pain:  Vitals:   07/24/17 1523  TempSrc: Temporal  PainSc:          Complications: No apparent anesthesia complications

## 2017-07-24 NOTE — H&P (Signed)
Reviewed paper H+P, will be scanned into chart. No changes noted.  

## 2017-07-24 NOTE — Anesthesia Postprocedure Evaluation (Signed)
Anesthesia Post Note  Patient: Alexa Garcia  Procedure(s) Performed: CARPAL TUNNEL RELEASE (Right Wrist)  Patient location during evaluation: PACU Anesthesia Type: General Level of consciousness: awake and alert and oriented Pain management: pain level controlled Vital Signs Assessment: post-procedure vital signs reviewed and stable Respiratory status: spontaneous breathing Cardiovascular status: blood pressure returned to baseline Anesthetic complications: no     Last Vitals:  Vitals:   07/24/17 1521 07/24/17 1523  BP:    Pulse: 73   Resp: 12   Temp: (!) 36.1 C (!) 36.1 C  SpO2: 100%     Last Pain:  Vitals:   07/24/17 1523  TempSrc: Temporal  PainSc:                  Zuhair Lariccia

## 2017-07-24 NOTE — Anesthesia Post-op Follow-up Note (Signed)
Anesthesia QCDR form completed.        

## 2017-07-24 NOTE — Anesthesia Procedure Notes (Signed)
Procedure Name: LMA Insertion Date/Time: 07/24/2017 3:46 PM Performed by: Johnna Acosta, CRNA Pre-anesthesia Checklist: Patient identified, Emergency Drugs available, Suction available, Patient being monitored and Timeout performed Patient Re-evaluated:Patient Re-evaluated prior to induction Oxygen Delivery Method: Circle system utilized Preoxygenation: Pre-oxygenation with 100% oxygen Induction Type: IV induction LMA: LMA inserted LMA Size: 4.5 Tube type: Oral Number of attempts: 1 Placement Confirmation: positive ETCO2 and breath sounds checked- equal and bilateral Tube secured with: Tape Dental Injury: Teeth and Oropharynx as per pre-operative assessment

## 2017-07-25 ENCOUNTER — Encounter: Payer: Self-pay | Admitting: Orthopedic Surgery

## 2017-08-20 ENCOUNTER — Ambulatory Visit
Admission: RE | Admit: 2017-08-20 | Discharge: 2017-08-20 | Disposition: A | Discharge status: Home / Self Care | Payer: 59 | Source: Ambulatory Visit | Attending: Obstetrics and Gynecology | Admitting: Obstetrics and Gynecology

## 2017-08-20 DIAGNOSIS — Z1231 Encounter for screening mammogram for malignant neoplasm of breast: Secondary | ICD-10-CM | POA: Diagnosis present

## 2017-10-21 ENCOUNTER — Other Ambulatory Visit: Payer: Self-pay

## 2017-10-21 ENCOUNTER — Encounter
Admission: RE | Admit: 2017-10-21 | Discharge: 2017-10-21 | Disposition: A | Discharge status: Home / Self Care | Payer: Commercial Managed Care - HMO | Source: Ambulatory Visit | Attending: Obstetrics and Gynecology | Admitting: Obstetrics and Gynecology

## 2017-10-21 DIAGNOSIS — N8110 Cystocele, unspecified: Secondary | ICD-10-CM | POA: Diagnosis not present

## 2017-10-21 DIAGNOSIS — N816 Rectocele: Secondary | ICD-10-CM | POA: Insufficient documentation

## 2017-10-21 DIAGNOSIS — Z01818 Encounter for other preprocedural examination: Secondary | ICD-10-CM | POA: Diagnosis present

## 2017-10-21 LAB — BASIC METABOLIC PANEL
Anion gap: 6 (ref 5–15)
BUN: 18 mg/dL (ref 6–20)
CALCIUM: 9.3 mg/dL (ref 8.9–10.3)
CHLORIDE: 108 mmol/L (ref 98–111)
CO2: 25 mmol/L (ref 22–32)
CREATININE: 0.74 mg/dL (ref 0.44–1.00)
GFR calc Af Amer: >60 mL/min (ref >60)
GFR calc non Af Amer: >60 mL/min (ref >60)
GLUCOSE: 97 mg/dL (ref 70–99)
Potassium: 4.4 mmol/L (ref 3.5–5.1)
Sodium: 139 mmol/L (ref 135–145)

## 2017-10-21 LAB — CBC
HCT: 44 % (ref 35.0–47.0)
Hemoglobin: 15 g/dL (ref 12.0–16.0)
MCH: 30.4 pg (ref 26.0–34.0)
MCHC: 34.1 g/dL (ref 32.0–36.0)
MCV: 89 fL (ref 80.0–100.0)
PLATELETS: 178 10*3/uL (ref 150–440)
RBC: 4.95 MIL/uL (ref 3.80–5.20)
RDW: 14.3 % (ref 11.5–14.5)
WBC: 4.3 10*3/uL (ref 3.6–11.0)

## 2017-10-21 LAB — TYPE AND SCREEN
ABO/RH(D): O POS
Antibody Screen: NEGATIVE

## 2017-10-21 NOTE — H&P (Signed)
Ms. Berko is a 52 y.o. female here for Prolapse and Anterior Posterior repair  .pt with tissue falling from the vagina for the last few days . No BM or urinary issues . Struggling with weight reduction . No t exercising . 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  Past Surgical History:  has a past surgical history that includes bladder polyps removed; Appendectomy; right foot hallux valgus corrective surgery; de Quervain's release (Right, 06/09/2002); Hammertoe surgery (1992); Oophorectomy (Right); Colonoscopy (02/14/2015); OPEN REDUCTION INTERNAL FIXATION (ORIF) DISTAL RADIAL FRACTURE, CLOSED REDUCTION PINNING OF DISTAL RADIAL ULNAR JOINT (Left) (Left, 04/04/2016); CARPAL TUNNEL RELEASE (Left), HARDWARE REMOVAL (Left) (Left, 10/08/2016); carpal tunnel release right  (Right, 07/24/2017); and Hysterectomy. Family History: family history includes Cancer in her mother; Coronary Artery Disease (Blocked arteries around heart) in her father; Diabetes in her father; Myocardial Infarction (Heart attack) in her father; Stroke in her father. Social History:  reports that she has quit smoking. She quit after 15.00 years of use. She has never used smokeless tobacco. She reports that she drinks alcohol. She reports that she does not use drugs. OB/GYN History:          OB History    Gravida  5   Para  4   Term      Preterm      AB  1   Living  4     SAB  1   TAB      Ectopic      Molar      Multiple      Live Births             Allergies: is allergic to other. Medications:  Current Outpatient Medications:  .  cetirizine (ZYRTEC) 10 MG tablet, Take 1 tablet by mouth once daily, Disp: , Rfl:  .  fluticasone (FLONASE) 50 mcg/actuation nasal spray, Place 2 sprays into both nostrils once daily., Disp: , Rfl:  .  HYDROcodone-acetaminophen  (NORCO) 5-325 mg tablet, Take 1 tablet by mouth every 6 (six) hours as needed, Disp: , Rfl:  .  multivitamin with iron-minerals (VITAMINS AND MINERALS) tablet, Take 1 tablet by mouth once daily, Disp: , Rfl:   Review of Systems: General:                      No fatigue or weight loss Eyes:                           No vision changes Ears:                            No hearing difficulty Respiratory:                No cough or shortness of breath Pulmonary:                  No asthma or shortness of breath Cardiovascular:           No chest pain, palpitations, dyspnea on exertion Gastrointestinal:          No abdominal bloating, chronic diarrhea, constipations, masses, pain or hematochezia Genitourinary:             No hematuria, dysuria, abnormal vaginal discharge, pelvic pain, Menometrorrhagia Lymphatic:  No swollen lymph nodes Musculoskeletal:         No muscle weakness Neurologic:                  No extremity weakness, syncope, seizure disorder Psychiatric:                  No history of depression, delusions or suicidal/homicidal ideation    Exam:      Vitals:   10/21/17 1621  BP: 118/81  Pulse: 78    Body mass index is 34.33 kg/m.  WDWN white/  female in NAD   Lungs: CTA  CV : RRR without murmur   Neck:  no thyromegaly Abdomen: soft , no mass, normal active bowel sounds,  non-tender, no rebound tenderness Pelvic: tanner stage 5 ,  External genitalia: vulva /labia no lesions Urethra: no prolapse Vagina: normal physiologic d/c second degree cystocele , grade 2-3 rectocele  Cervix:absent  Uterus: absent Adnexa: no mass,  non-tender   Rectovaginal:   Impression:   The primary encounter diagnosis was Cystocele, midline. A diagnosis of Rectocele, female was also pertinent to this visit.    Plan:   I have spoken with the patient regarding treatment options including expectant management,pessary use , or surgical intervention. After a  full discussion the pt elects to proceed with  Anterior and posterior repair . Procedure discussed in detail and possible complications..ie urinary retention , dyspareunia , urinary incontinence       THOMAS Samuel Germany, MD

## 2017-10-21 NOTE — Patient Instructions (Signed)
Your procedure is scheduled on: Monday 10/27/17 Report to [facility]. To find out your arrival time please call [phone] between 1PM - 3PM on Friday 10/24/17.  Remember: Instructions that are not followed completely may result in serious medical risk, up to and including death, or upon the discretion of your surgeon and anesthesiologist your surgery may need to be rescheduled.     _X__ 1. Do not eat food after midnight the night before your procedure.                 No gum chewing or hard candies. You may drink clear liquids up to 2 hours                 before you are scheduled to arrive for your surgery- DO not drink clear                 liquids within 2 hours of the start of your surgery.                 Clear Liquids include:  water, apple juice without pulp, clear carbohydrate                 drink such as Clearfast or Gatorade, Black Coffee or Tea (Do not add                 anything to coffee or tea).  __X__2.  On the morning of surgery brush your teeth with toothpaste and water, you                 may rinse your mouth with mouthwash if you wish.  Do not swallow any              toothpaste of mouthwash.     _X__ 3.  No Alcohol for 24 hours before or after surgery.   _X__ 4.  Do Not Smoke or use e-cigarettes For 24 Hours Prior to Your Surgery.                 Do not use any chewable tobacco products for at least 6 hours prior to                 surgery.  ____  5.  Bring all medications with you on the day of surgery if instructed.   __X__  6.  Notify your doctor if there is any change in your medical condition      (cold, fever, infections).     Do not wear jewelry, make-up, hairpins, clips or nail polish. Do not wear lotions, powders, or perfumes.  Do not shave 48 hours prior to surgery. Men may shave face and neck. Do not bring valuables to the hospital.    [facility] is not responsible for any belongings or  valuables.  Contacts, dentures/partials or body piercings may not be worn into surgery. Bring a case for your contacts, glasses or hearing aids, a denture cup will be supplied. Leave your suitcase in the car. After surgery it may be brought to your room. For patients admitted to the hospital, discharge time is determined by your treatment team.   Patients discharged the day of surgery will not be allowed to drive home.   Please read over the following fact sheets that you were given:   MRSA Information  __X__ Take these medicines the morning of surgery with A SIP OF WATER:  1. cetirizine (ZYRTEC) 10 MG tablet if needed  2.   3.   4.  5.  6.  ____ Fleet Enema (as directed)   ____ Use CHG Soap/SAGE wipes as directed  ____ Use inhalers on the day of surgery  ____ Stop metformin/Janumet/Farxiga 2 days prior to surgery    ____ Take 1/2 of usual insulin dose the night before surgery. No insulin the morning          of surgery.   ____ Stop Blood Thinners Coumadin/Plavix/Xarelto/Pleta/Pradaxa/Eliquis/Effient/Aspirin  on   Or contact your Surgeon, Cardiologist or Medical Doctor regarding  ability to stop your blood thinners  __X__ Stop Anti-inflammatories 7 days before surgery such as Advil, Ibuprofen, Motrin,  BC or Goodies Powder, Naprosyn, Naproxen, Aleve, Aspirin MAY USE TYLENOL IF NEEDED   __X__ Stop all herbal supplements, fish oil or vitamin E until after surgery.    ____ Bring C-Pap to the hospital.

## 2017-10-27 ENCOUNTER — Other Ambulatory Visit: Payer: Self-pay

## 2017-10-27 ENCOUNTER — Encounter
Admission: RE | Disposition: A | Discharge status: Home / Self Care | Payer: Self-pay | Source: Ambulatory Visit | Attending: Obstetrics and Gynecology

## 2017-10-27 ENCOUNTER — Ambulatory Visit: Payer: Commercial Managed Care - HMO | Admitting: Anesthesiology

## 2017-10-27 ENCOUNTER — Encounter: Payer: Self-pay | Admitting: *Deleted

## 2017-10-27 ENCOUNTER — Observation Stay
Admission: RE | Admit: 2017-10-27 | Discharge: 2017-10-28 | Disposition: A | Discharge status: Home / Self Care | Payer: Commercial Managed Care - HMO | Source: Ambulatory Visit | Attending: Obstetrics and Gynecology | Admitting: Obstetrics and Gynecology

## 2017-10-27 DIAGNOSIS — N8110 Cystocele, unspecified: Secondary | ICD-10-CM | POA: Diagnosis present

## 2017-10-27 DIAGNOSIS — Z9889 Other specified postprocedural states: Secondary | ICD-10-CM

## 2017-10-27 DIAGNOSIS — Z79899 Other long term (current) drug therapy: Secondary | ICD-10-CM | POA: Diagnosis not present

## 2017-10-27 DIAGNOSIS — M1990 Unspecified osteoarthritis, unspecified site: Secondary | ICD-10-CM | POA: Insufficient documentation

## 2017-10-27 DIAGNOSIS — N815 Vaginal enterocele: Secondary | ICD-10-CM | POA: Diagnosis not present

## 2017-10-27 DIAGNOSIS — Z7951 Long term (current) use of inhaled steroids: Secondary | ICD-10-CM | POA: Diagnosis not present

## 2017-10-27 DIAGNOSIS — Z87891 Personal history of nicotine dependence: Secondary | ICD-10-CM | POA: Insufficient documentation

## 2017-10-27 DIAGNOSIS — N816 Rectocele: Secondary | ICD-10-CM | POA: Diagnosis not present

## 2017-10-27 DIAGNOSIS — K219 Gastro-esophageal reflux disease without esophagitis: Secondary | ICD-10-CM | POA: Diagnosis not present

## 2017-10-27 HISTORY — PX: ANTERIOR AND POSTERIOR REPAIR: SHX5121

## 2017-10-27 LAB — ABO/RH: ABO/RH(D): O POS

## 2017-10-27 SURGERY — ANTERIOR (CYSTOCELE) AND POSTERIOR REPAIR (RECTOCELE)
Anesthesia: General | Site: Vagina | Wound class: Clean Contaminated

## 2017-10-27 MED ORDER — ONDANSETRON HCL 4 MG/2ML IJ SOLN
INTRAMUSCULAR | Status: AC
  Filled 2017-10-27: qty 2

## 2017-10-27 MED ORDER — IBUPROFEN 600 MG PO TABS
600.0000 mg | ORAL_TABLET | Freq: Four times a day (QID) | ORAL | Status: DC | PRN
  Filled 2017-10-27 (×2): qty 1

## 2017-10-27 MED ORDER — PROMETHAZINE HCL 25 MG/ML IJ SOLN: 6.2500 mg | INTRAMUSCULAR | Status: DC | PRN

## 2017-10-27 MED ORDER — LIDOCAINE HCL (CARDIAC) PF 100 MG/5ML IV SOSY
PREFILLED_SYRINGE | INTRAVENOUS | Status: DC | PRN
  Administered 2017-10-27: 100 mg via INTRAVENOUS

## 2017-10-27 MED ORDER — DEXAMETHASONE SODIUM PHOSPHATE 10 MG/ML IJ SOLN
INTRAMUSCULAR | Status: AC
  Filled 2017-10-27: qty 1

## 2017-10-27 MED ORDER — SCOPOLAMINE 1 MG/3DAYS TD PT72
1.0000 | MEDICATED_PATCH | Freq: Once | TRANSDERMAL | Status: DC
  Administered 2017-10-27: 1.5 mg via TRANSDERMAL

## 2017-10-27 MED ORDER — FENTANYL CITRATE (PF) 250 MCG/5ML IJ SOLN
INTRAMUSCULAR | Status: AC
  Filled 2017-10-27: qty 5

## 2017-10-27 MED ORDER — ONDANSETRON HCL 4 MG/2ML IJ SOLN: 4.0000 mg | Freq: Four times a day (QID) | INTRAMUSCULAR | Status: DC | PRN

## 2017-10-27 MED ORDER — ONDANSETRON HCL 4 MG PO TABS: 4.0000 mg | ORAL_TABLET | Freq: Four times a day (QID) | ORAL | Status: DC | PRN

## 2017-10-27 MED ORDER — FAMOTIDINE 20 MG PO TABS
ORAL_TABLET | ORAL | Status: AC
  Administered 2017-10-27: 20 mg via ORAL
  Filled 2017-10-27: qty 1

## 2017-10-27 MED ORDER — PROPOFOL 10 MG/ML IV BOLUS
INTRAVENOUS | Status: DC | PRN
  Administered 2017-10-27: 180 mg via INTRAVENOUS

## 2017-10-27 MED ORDER — FENTANYL CITRATE (PF) 100 MCG/2ML IJ SOLN
INTRAMUSCULAR | Status: DC | PRN
  Administered 2017-10-27: 100 ug via INTRAVENOUS

## 2017-10-27 MED ORDER — FENTANYL CITRATE (PF) 100 MCG/2ML IJ SOLN
INTRAMUSCULAR | Status: AC
  Filled 2017-10-27: qty 2

## 2017-10-27 MED ORDER — ESTROGENS, CONJUGATED 0.625 MG/GM VA CREA
TOPICAL_CREAM | VAGINAL | Status: AC
  Filled 2017-10-27: qty 30

## 2017-10-27 MED ORDER — MORPHINE SULFATE (PF) 2 MG/ML IV SOLN: 1.0000 mg | INTRAVENOUS | Status: DC | PRN

## 2017-10-27 MED ORDER — LACTATED RINGERS IV SOLN
INTRAVENOUS | Status: DC
  Administered 2017-10-28: 02:00:00 via INTRAVENOUS

## 2017-10-27 MED ORDER — ESTROGENS, CONJUGATED 0.625 MG/GM VA CREA
TOPICAL_CREAM | VAGINAL | Status: DC | PRN
  Administered 2017-10-27: 1 via VAGINAL

## 2017-10-27 MED ORDER — ROCURONIUM BROMIDE 100 MG/10ML IV SOLN
INTRAVENOUS | Status: DC | PRN
  Administered 2017-10-27: 50 mg via INTRAVENOUS

## 2017-10-27 MED ORDER — LACTATED RINGERS IV SOLN
INTRAVENOUS | Status: DC
  Administered 2017-10-27: 10:00:00 via INTRAVENOUS

## 2017-10-27 MED ORDER — HYDROCODONE-ACETAMINOPHEN 5-325 MG PO TABS
1.0000 | ORAL_TABLET | ORAL | Status: DC | PRN
  Administered 2017-10-28: 1 via ORAL
  Filled 2017-10-27: qty 1

## 2017-10-27 MED ORDER — SILVER NITRATE-POT NITRATE 75-25 % EX MISC
CUTANEOUS | Status: AC
  Filled 2017-10-27: qty 3

## 2017-10-27 MED ORDER — CEFAZOLIN SODIUM-DEXTROSE 2-4 GM/100ML-% IV SOLN
2.0000 g | Freq: Once | INTRAVENOUS | Status: AC
Start: 2017-10-27 — End: 2017-10-27
  Administered 2017-10-27: 2 g via INTRAVENOUS

## 2017-10-27 MED ORDER — OXYCODONE HCL 5 MG/5ML PO SOLN: 5.0000 mg | Freq: Once | ORAL | Status: DC | PRN

## 2017-10-27 MED ORDER — LIDOCAINE-EPINEPHRINE 1 %-1:100000 IJ SOLN
INTRAMUSCULAR | Status: AC
  Filled 2017-10-27: qty 1

## 2017-10-27 MED ORDER — PROPOFOL 10 MG/ML IV BOLUS
INTRAVENOUS | Status: AC
  Filled 2017-10-27: qty 20

## 2017-10-27 MED ORDER — FENTANYL CITRATE (PF) 100 MCG/2ML IJ SOLN
25.0000 ug | INTRAMUSCULAR | Status: DC | PRN
  Administered 2017-10-27 (×2): 50 ug via INTRAVENOUS

## 2017-10-27 MED ORDER — CEFAZOLIN SODIUM-DEXTROSE 2-4 GM/100ML-% IV SOLN
INTRAVENOUS | Status: AC
  Filled 2017-10-27: qty 100

## 2017-10-27 MED ORDER — SUGAMMADEX SODIUM 200 MG/2ML IV SOLN
INTRAVENOUS | Status: DC | PRN
  Administered 2017-10-27: 180 mg via INTRAVENOUS

## 2017-10-27 MED ORDER — ACETAMINOPHEN 10 MG/ML IV SOLN
INTRAVENOUS | Status: DC | PRN
  Administered 2017-10-27: 1000 mg via INTRAVENOUS

## 2017-10-27 MED ORDER — FLEET ENEMA 7-19 GM/118ML RE ENEM: 1.0000 | ENEMA | Freq: Once | RECTAL | Status: DC

## 2017-10-27 MED ORDER — ACETAMINOPHEN 10 MG/ML IV SOLN
INTRAVENOUS | Status: AC
  Filled 2017-10-27: qty 100

## 2017-10-27 MED ORDER — FAMOTIDINE 20 MG PO TABS
20.0000 mg | ORAL_TABLET | Freq: Once | ORAL | Status: AC
  Administered 2017-10-27: 20 mg via ORAL

## 2017-10-27 MED ORDER — MIDAZOLAM HCL 2 MG/2ML IJ SOLN
INTRAMUSCULAR | Status: AC
  Filled 2017-10-27: qty 2

## 2017-10-27 MED ORDER — SCOPOLAMINE 1 MG/3DAYS TD PT72
MEDICATED_PATCH | TRANSDERMAL | Status: AC
  Administered 2017-10-27: 1.5 mg via TRANSDERMAL
  Filled 2017-10-27: qty 1

## 2017-10-27 MED ORDER — KETOROLAC TROMETHAMINE 30 MG/ML IJ SOLN
INTRAMUSCULAR | Status: AC
  Filled 2017-10-27: qty 1

## 2017-10-27 MED ORDER — ONDANSETRON HCL 4 MG/2ML IJ SOLN
INTRAMUSCULAR | Status: DC | PRN
  Administered 2017-10-27: 4 mg via INTRAVENOUS

## 2017-10-27 MED ORDER — LIDOCAINE-EPINEPHRINE 1 %-1:100000 IJ SOLN
INTRAMUSCULAR | Status: DC | PRN
  Administered 2017-10-27: 20 mL

## 2017-10-27 MED ORDER — SOD CITRATE-CITRIC ACID 500-334 MG/5ML PO SOLN: 30.0000 mL | ORAL | Status: DC

## 2017-10-27 MED ORDER — DEXAMETHASONE SODIUM PHOSPHATE 10 MG/ML IJ SOLN
INTRAMUSCULAR | Status: DC | PRN
  Administered 2017-10-27: 10 mg via INTRAVENOUS

## 2017-10-27 MED ORDER — OXYCODONE HCL 5 MG PO TABS: 5.0000 mg | ORAL_TABLET | Freq: Once | ORAL | Status: DC | PRN

## 2017-10-27 SURGICAL SUPPLY — 33 items
BAG URINE DRAINAGE (UROLOGICAL SUPPLIES) ×4 IMPLANT
CANISTER SUCT 1200ML W/VALVE (MISCELLANEOUS) ×4 IMPLANT
CATH FOLEY 2WAY  5CC 16FR (CATHETERS) ×2
CATH ROBINSON RED A/P 16FR (CATHETERS) ×4 IMPLANT
CATH URTH 16FR FL 2W BLN LF (CATHETERS) ×2 IMPLANT
DRAPE PERI LITHO V/GYN (MISCELLANEOUS) ×4 IMPLANT
DRAPE SURG 17X11 SM STRL (DRAPES) ×4 IMPLANT
DRAPE UNDER BUTTOCK W/FLU (DRAPES) ×4 IMPLANT
ELECT REM PT RETURN 9FT ADLT (ELECTROSURGICAL) ×4
ELECTRODE REM PT RTRN 9FT ADLT (ELECTROSURGICAL) ×2 IMPLANT
GAUZE PACK 2X3YD (MISCELLANEOUS) ×4 IMPLANT
GLOVE BIO SURGEON STRL SZ8 (GLOVE) ×4 IMPLANT
GOWN STRL REUS W/ TWL LRG LVL3 (GOWN DISPOSABLE) ×6 IMPLANT
GOWN STRL REUS W/ TWL XL LVL3 (GOWN DISPOSABLE) ×2 IMPLANT
GOWN STRL REUS W/TWL LRG LVL3 (GOWN DISPOSABLE) ×6
GOWN STRL REUS W/TWL XL LVL3 (GOWN DISPOSABLE) ×2
KIT TURNOVER CYSTO (KITS) ×4 IMPLANT
KIT TURNOVER KIT A (KITS) ×4 IMPLANT
LABEL OR SOLS (LABEL) ×4 IMPLANT
NEEDLE HYPO 22GX1.5 SAFETY (NEEDLE) ×4 IMPLANT
NS IRRIG 500ML POUR BTL (IV SOLUTION) ×4 IMPLANT
PACK BASIN MINOR ARMC (MISCELLANEOUS) ×4 IMPLANT
PAD OB MATERNITY 4.3X12.25 (PERSONAL CARE ITEMS) ×4 IMPLANT
PAD PREP 24X41 OB/GYN DISP (PERSONAL CARE ITEMS) ×4 IMPLANT
SUT ETHIBOND 2-0 (SUTURE) ×4 IMPLANT
SUT PDS AB 2-0 CT1 27 (SUTURE) ×4 IMPLANT
SUT VIC AB 0 CT1 36 (SUTURE) ×8 IMPLANT
SUT VIC AB 2-0 CT1 36 (SUTURE) ×8 IMPLANT
SUT VIC AB 2-0 SH 27 (SUTURE) ×8
SUT VIC AB 2-0 SH 27XBRD (SUTURE) ×8 IMPLANT
SUT VIC AB 3-0 SH 27 (SUTURE) ×4
SUT VIC AB 3-0 SH 27X BRD (SUTURE) ×4 IMPLANT
SYR 10ML LL (SYRINGE) ×4 IMPLANT

## 2017-10-27 NOTE — Progress Notes (Signed)
Pt ready for A+P repair . NPO . Labs nl .  All questions answered . Proceed

## 2017-10-27 NOTE — Progress Notes (Signed)
Patient ID: Alexa Garcia, female   DOB: 1965-05-17, 52 y.o.   MRN: [id number] DOS doing well , no pain meds . Nausea control with scopolamine patch  VSS Remove patch so as not to interfere with urination in am

## 2017-10-27 NOTE — Progress Notes (Signed)
Dr. Ouida Sills notified that vaginal packing that is exiting the vagina is saturated and there is a mod. Amount of serosang. Drainage on Chux.  Dr. Ouida Sills stated this was fine. No new orders received. Will cont. To monitor.

## 2017-10-27 NOTE — Anesthesia Postprocedure Evaluation (Signed)
Anesthesia Post Note  Patient: Alexa Garcia  Procedure(s) Performed: ANTERIOR (CYSTOCELE) AND POSTERIOR REPAIR (RECTOCELE) (N/A Vagina ) REPAIR OF ENTEROCELE (N/A )  Patient location during evaluation: PACU Anesthesia Type: General Level of consciousness: awake and alert Pain management: pain level controlled Vital Signs Assessment: post-procedure vital signs reviewed and stable Respiratory status: spontaneous breathing, nonlabored ventilation, respiratory function stable and patient connected to nasal cannula oxygen Cardiovascular status: blood pressure returned to baseline and stable Postop Assessment: no apparent nausea or vomiting Anesthetic complications: no     Last Vitals:  Vitals:   10/27/17 1404 10/27/17 1411  BP: (!) 106/53 103/61  Pulse: 61 (!) 58  Resp: 16 16  Temp: 36.8 C   SpO2: 94% 94%    Last Pain:  Vitals:   10/27/17 1355  TempSrc:   PainSc: 3                  Precious Haws Fordyce Lepak

## 2017-10-27 NOTE — Anesthesia Post-op Follow-up Note (Signed)
Anesthesia QCDR form completed.        

## 2017-10-27 NOTE — Brief Op Note (Signed)
10/27/2017  1:06 PM  PATIENT:  Alexa Garcia  52 y.o. female  PRE-OPERATIVE DIAGNOSIS:  cystocele, rectocele  POST-OPERATIVE DIAGNOSIS:  cystocele, rectocele, enterocoele  PROCEDURE:  Procedure(s): ANTERIOR (CYSTOCELE) AND POSTERIOR REPAIR (RECTOCELE) (N/A) REPAIR OF ENTEROCELE (N/A)  SURGEON:  Surgeon(s) and Role:    * Weiland Tomich, Gwen Her, MD - Primary    * Ward, Honor Loh, MD - Assisting  PHYSICIAN ASSISTANT:   ASSISTANTS: none   ANESTHESIA:   general  EBL:  20 mL   BLOOD ADMINISTERED:none  DRAINS: Urinary Catheter (Foley)   LOCAL MEDICATIONS USED:  LIDOCAINE  and Amount: 15 ml  SPECIMEN:  No Specimen  DISPOSITION OF SPECIMEN:  N/A  COUNTS:  YES  TOURNIQUET:  * No tourniquets in log *  DICTATION: .Other Dictation: Dictation Number verbal  PLAN OF CARE: Admit for overnight observation  PATIENT DISPOSITION:  PACU - hemodynamically stable.   Delay start of Pharmacological VTE agent (>24hrs) due to surgical blood loss or risk of bleeding: not applicable

## 2017-10-27 NOTE — Op Note (Signed)
NAME: Alexa Garcia, Alexa Garcia MEDICAL RECORD [id number] ACCOUNT [id number] DATE OF BIRTH:07-16-1965 FACILITY: [facility] LOCATION: [facility] PHYSICIAN:Vicy Medico Josefine Class, MD  OPERATIVE REPORT  DATE OF PROCEDURE:    PREOPERATIVE DIAGNOSIS:  Pelvic organ prolapse with cystocele and rectocele.  POSTOPERATIVE DIAGNOSIS:  Rectocele, third degree cystocele, second degree enterocele.  PROCEDURE: 1.  Anterior colporrhaphy. 2.  Posterior colporrhaphy. 3.  Enterocele repair.  ANESTHESIA:  General endotracheal anesthesia.  SURGEON:  Laverta Baltimore, MD.  FIRST ASSISTANT:  Ward, MD.  INDICATIONS:  A 52 year old gravida 5, para 4 patient with a second degree cystocele and a third degree rectocele noted on exam.  The patient was complaining of falling tissue from the vagina.  DESCRIPTION OF PROCEDURE:  After adequate general endotracheal anesthesia, the patient's abdomen, perineum and vagina were prepped and draped in normal sterile fashion.  The patient's legs were placed in the candy cane stirrups.  A timeout was performed.   The patient did receive 2 grams IV Ancef prior to commencement of the case.  Straight catheterization of the bladder yielded 50 mL clear urine.  A weighted speculum was placed in the posterior vagina and the anterior vagina was identified and grasped  at the superior cuff.  The anterior vaginal wall was placed on traction and the subvaginal epithelium was then injected with 1% lidocaine with 1:100,000 epinephrine in a central fashion.  Two Allis clamps were placed at the cuff to add for traction and a  7 mm incision was made through the vaginal epithelium.  Metzenbaum scissors were used to dissect the subvaginal epithelium from the cuff to the 1.5 cm tissue inferior to the urethral meatus.  The bladder was then dissected off the subvaginal tissues.   This was done with sharp dissection and blunt rolling of the tissue.  Once the cystocele was isolated, horizontal mattress  sutures were used with 2-0 Vicryl to incorporate healthy endopelvic fascial tissue and to reduce the defect.  Several sutures were  used.  Good cosmetic effect.  The vaginal epithelium was then trimmed and the vaginal epithelium was closed with 0 Vicryl suture.  Attention then was directed to the posterior vagina where the hymenal ring was grasped with two Allis clamps approximately  2 cm apart.  The vaginal epithelium was then injected centrally as well.  A wedge-like triangle was taken from the hymenal ring to the perineal body.  Again, likewise a small incision was made through the central portion of the posterior vaginal  epithelium with the Metzenbaum scissors were used to dissect subepithelially.  Tissues were opened and grasped with hemostats and the rectocele was dissected off of the epithelial tissue.  The superior aspect of the rectocele demonstrated there was  enterocele defect.  This defect was identified and 2 pursestring sutures of 2-0 Ethibond sutures were used to reduce this defect.  The rectocele repair then followed with perirectal fascial tissue approximating centrally with 2-0 Vicryl suture with  horizontal mattress closure.  Several sutures were used with good closure of defect.  The vaginal epithelium was then trimmed and then closed with 0 Vicryl running locking suture.  Good approximation of tissue.  Good hemostasis was noted.  The vagina did  accommodate two fingers with a slight narrowing at the apex, but certainly deemed adequate for future sexual activity if she ultimately engages.  The Foley catheter was placed and the vagina was packed with vaginal packing with Premarin cream.  There  were no complications.  The patient tolerated the procedure well.  ESTIMATED BLOOD LOSS:  20 mL.  INTRAOPERATIVE FLUIDS:  900 mL.  URINE OUTPUT:  100 mL.  The patient was taken to recovery room in good condition.  TN/NUANCE  D:10/27/2017 T:10/27/2017 JOB:[id number]

## 2017-10-27 NOTE — Transfer of Care (Signed)
Immediate Anesthesia Transfer of Care Note  Patient: Alexa Garcia  Procedure(s) Performed: ANTERIOR (CYSTOCELE) AND POSTERIOR REPAIR (RECTOCELE) (N/A Vagina ) REPAIR OF ENTEROCELE (N/A )  Patient Location: PACU  Anesthesia Type:General  Level of Consciousness: sedated  Airway & Oxygen Therapy: Patient Spontanous Breathing and Patient connected to face mask oxygen  Post-op Assessment: Report given to RN and Post -op Vital signs reviewed and stable  Post vital signs: Reviewed and stable  Last Vitals:  Vitals Value Taken Time  BP 123/59 10/27/2017  1:18 PM  Temp    Pulse 89 10/27/2017  1:21 PM  Resp 21 10/27/2017  1:21 PM  SpO2 100 % 10/27/2017  1:21 PM  Vitals shown include unvalidated device data.  Last Pain:  Vitals:   10/27/17 0919  TempSrc: Temporal  PainSc: 0-No pain         Complications: No apparent anesthesia complications

## 2017-10-27 NOTE — Anesthesia Preprocedure Evaluation (Signed)
Anesthesia Evaluation  Patient identified by MRN, date of birth, ID band Patient awake    Reviewed: Allergy & Precautions, H&P , NPO status , Patient's Chart, lab work & pertinent test results  History of Anesthesia Complications (+) PONV and history of anesthetic complications  Airway Mallampati: III  TM Distance: >3 FB Neck ROM: full    Dental  (+) Chipped, Missing, Implants   Pulmonary neg shortness of breath, former smoker,           Cardiovascular Exercise Tolerance: Good (-) angina(-) Past MI and (-) DOE negative cardio ROS       Neuro/Psych  Headaches, PSYCHIATRIC DISORDERS Anxiety    GI/Hepatic Neg liver ROS, GERD  Medicated and Controlled,  Endo/Other  negative endocrine ROS  Renal/GU      Musculoskeletal  (+) Arthritis ,   Abdominal   Peds  Hematology negative hematology ROS (+)   Anesthesia Other Findings Past Medical History: No date: Anxiety No date: Arthritis     Comment:  BACK No date: Complication of anesthesia     Comment:  MASK MADE PT VERY ANXIOUS AND SHE FELT LIKE SHE COULDNT               BREATHE No date: GERD (gastroesophageal reflux disease)     Comment:  H/O No date: Headache     Comment:  H/O migranes No date: Hyperlipidemia No date: PONV (postoperative nausea and vomiting)  Past Surgical History: No date: ABDOMINAL HYSTERECTOMY No date: APPENDECTOMY No date: BUNIONECTOMY 10/08/2016: CARPAL TUNNEL RELEASE; Left     Comment:  Procedure: CARPAL TUNNEL RELEASE;  Surgeon: Hessie Knows, MD;  Location: [facility] ORS;  Service: Orthopedics;               Laterality: Left; 07/24/2017: CARPAL TUNNEL RELEASE; Right     Comment:  Procedure: CARPAL TUNNEL RELEASE;  Surgeon: Hessie Knows, MD;  Location: [facility];  Service: Orthopedics;               Laterality: Right; 02/14/2015: COLONOSCOPY; N/A     Comment:  Procedure: COLONOSCOPY;  Surgeon: Lollie Sails,  MD;              Location: [facility] ENDOSCOPY;  Service: Endoscopy;                Laterality: N/A; No date: HAMMER TOE SURGERY 10/08/2016: HARDWARE REMOVAL; Left     Comment:  Procedure: HARDWARE REMOVAL;  Surgeon: Hessie Knows,               MD;  Location: [facility] ORS;  Service: Orthopedics;                Laterality: Left; 04/04/2016: OPEN REDUCTION INTERNAL FIXATION (ORIF) DISTAL RADIAL  FRACTURE; Left     Comment:  Procedure: OPEN REDUCTION INTERNAL FIXATION (ORIF)               DISTAL RADIAL FRACTURE,  CLOSED REDUCTION PINNING OF               DISTAL RADIAL ULNAR JOINT;  Surgeon: Hessie Knows, MD;                Location: [facility] ORS;  Service: Orthopedics;  Laterality:               Left;  BMI  Body Mass Index:  34.41 kg/m      Reproductive/Obstetrics negative OB ROS                             Anesthesia Physical Anesthesia Plan  ASA: III  Anesthesia Plan: General ETT   Post-op Pain Management:    Induction: Intravenous  PONV Risk Score and Plan: Ondansetron, Dexamethasone, Midazolam, Scopolamine patch - Pre-op and Treatment may vary due to age or medical condition  Airway Management Planned: Oral ETT  Additional Equipment:   Intra-op Plan:   Post-operative Plan: Extubation in OR  Informed Consent: I have reviewed the patients History and Physical, chart, labs and discussed the procedure including the risks, benefits and alternatives for the proposed anesthesia with the patient or authorized representative who has indicated his/her understanding and acceptance.   Dental Advisory Given  Plan Discussed with: Anesthesiologist, CRNA and Surgeon  Anesthesia Plan Comments: (Patient consented for risks of anesthesia including but not limited to:  - adverse reactions to medications - damage to teeth, lips or other oral mucosa - sore throat or hoarseness - Damage to heart, brain, lungs or loss of life  Patient voiced understanding.)         Anesthesia Quick Evaluation

## 2017-10-28 DIAGNOSIS — N8110 Cystocele, unspecified: Secondary | ICD-10-CM | POA: Diagnosis not present

## 2017-10-28 LAB — BASIC METABOLIC PANEL
ANION GAP: 5 (ref 5–15)
BUN: 14 mg/dL (ref 6–20)
CO2: 26 mmol/L (ref 22–32)
Calcium: 8.7 mg/dL — ABNORMAL LOW (ref 8.9–10.3)
Chloride: 110 mmol/L (ref 98–111)
Creatinine, Ser: 0.78 mg/dL (ref 0.44–1.00)
GFR calc Af Amer: >60 mL/min (ref >60)
GLUCOSE: 117 mg/dL — AB (ref 70–99)
POTASSIUM: 4.4 mmol/L (ref 3.5–5.1)
Sodium: 141 mmol/L (ref 135–145)

## 2017-10-28 LAB — CBC
HCT: 40.2 % (ref 35.0–47.0)
Hemoglobin: 13.7 g/dL (ref 12.0–16.0)
MCH: 30 pg (ref 26.0–34.0)
MCHC: 34.1 g/dL (ref 32.0–36.0)
MCV: 87.9 fL (ref 80.0–100.0)
PLATELETS: 185 10*3/uL (ref 150–440)
RBC: 4.57 MIL/uL (ref 3.80–5.20)
RDW: 14.2 % (ref 11.5–14.5)
WBC: 11.1 10*3/uL — AB (ref 3.6–11.0)

## 2017-10-28 MED ORDER — IBUPROFEN 600 MG PO TABS: 600.0000 mg | ORAL_TABLET | Freq: Four times a day (QID) | ORAL | 0 refills | Status: DC | PRN

## 2017-10-28 MED ORDER — HYDROCODONE-ACETAMINOPHEN 5-325 MG PO TABS: 1.0000 | ORAL_TABLET | ORAL | 0 refills | Status: DC | PRN

## 2017-10-28 MED ORDER — DOCUSATE SODIUM 100 MG PO CAPS: 100.0000 mg | ORAL_CAPSULE | Freq: Every day | ORAL | 2 refills | Status: AC | PRN

## 2017-10-28 MED ORDER — ONDANSETRON HCL 8 MG PO TABS: 8.0000 mg | ORAL_TABLET | Freq: Three times a day (TID) | ORAL | 0 refills | Status: DC | PRN

## 2017-10-28 MED ORDER — BETHANECHOL CHLORIDE 5 MG PO TABS: 5.0000 mg | ORAL_TABLET | Freq: Three times a day (TID) | ORAL | 0 refills | Status: DC

## 2017-10-28 MED ORDER — BETHANECHOL CHLORIDE 10 MG PO TABS
5.0000 mg | ORAL_TABLET | Freq: Once | ORAL | Status: AC
  Administered 2017-10-28: 5 mg via ORAL
  Filled 2017-10-28: qty 0.5

## 2017-10-28 NOTE — Progress Notes (Signed)
Dr. Ouida Sills notified about second voiding trial- verbal order read back and verified for Urecholine 5 mg once and do another voiding trial. Patient updated on current plan of care.

## 2017-10-28 NOTE — Discharge Summary (Signed)
Physician Discharge Summary  Patient ID: Alexa Garcia MRN: [id number] DOB/AGE: 54-Sep-1967 54 y.o.  Admit date: 10/27/2017 Discharge date: 10/28/2017  Admission Diagnoses:pelvic organ prolapse  Discharge Diagnoses: cystocele, rectocele and enterocele Active Problems:   Postoperative state   Discharged Condition: good  Hospital Course: uncomplicated Anterior and Posterior repair and enterocele repair   Consults: None  Significant Diagnostic Studies: labs:  Results for orders placed or performed during the hospital encounter of 10/27/17 (from the past 24 hour(s))  ABO/Rh     Status: None   Collection Time: 10/27/17  9:14 AM  Result Value Ref Range   ABO/RH(D)      O POS Performed at [facility], [address] [place], [place] [address]   CBC     Status: Abnormal   Collection Time: 10/28/17  5:23 AM  Result Value Ref Range   WBC 11.1 (H) 3.6 - 11.0 K/uL   RBC 4.57 3.80 - 5.20 MIL/uL   Hemoglobin 13.7 12.0 - 16.0 g/dL   HCT 40.2 35.0 - 47.0 %   MCV 87.9 80.0 - 100.0 fL   MCH 30.0 26.0 - 34.0 pg   MCHC 34.1 32.0 - 36.0 g/dL   RDW 14.2 11.5 - 14.5 %   Platelets 185 150 - 440 K/uL  Basic metabolic panel     Status: Abnormal   Collection Time: 10/28/17  5:23 AM  Result Value Ref Range   Sodium 141 135 - 145 mmol/L   Potassium 4.4 3.5 - 5.1 mmol/L   Chloride 110 98 - 111 mmol/L   CO2 26 22 - 32 mmol/L   Glucose, Bld 117 (H) 70 - 99 mg/dL   BUN 14 6 - 20 mg/dL   Creatinine, Ser 0.78 0.44 - 1.00 mg/dL   Calcium 8.7 (L) 8.9 - 10.3 mg/dL   GFR calc non Af Amer >60 >60 mL/min   GFR calc Af Amer >60 >60 mL/min   Anion gap 5 5 - 15     Treatments: surgery: surgery as above   Discharge Exam: Blood pressure (!) 104/57, pulse 79, temperature 98.1 F (36.7 C), temperature source Oral, resp. rate 16, weight 90.9 kg, SpO2 99 %. General appearance: alert and cooperative Resp: clear to auscultation bilaterally Cardio: regular rate and rhythm, S1, S2 normal, no  murmur, click, rub or gallop GI: soft, non-tender; bowel sounds normal; no masses,  no organomegaly  Disposition: home   Allergies as of 10/28/2017      Reactions   Other Nausea And Vomiting   general anesthesia       Medication List    TAKE these medications   cetirizine 10 MG tablet Commonly known as:  ZYRTEC Take 10 mg by mouth daily as needed for allergies.   docusate sodium 100 MG capsule Commonly known as:  COLACE Take 1 capsule (100 mg total) by mouth daily as needed.   fluticasone 50 MCG/ACT nasal spray Commonly known as:  FLONASE Place 1-2 sprays into both nostrils daily as needed (for allergies.).   HYDROcodone-acetaminophen 5-325 MG tablet Commonly known as:  NORCO/VICODIN Take 1-2 tablets by mouth every 4 (four) hours as needed for moderate pain. What changed:    how much to take  when to take this   ibuprofen 600 MG tablet Commonly known as:  ADVIL,MOTRIN Take 1 tablet (600 mg total) by mouth every 6 (six) hours as needed for fever or headache.   multivitamin with minerals Tabs tablet Take 3 tablets by mouth daily.  ondansetron 8 MG tablet Commonly known as:  ZOFRAN Take 1 tablet (8 mg total) by mouth every 8 (eight) hours as needed for nausea or vomiting.      Follow-up Information    Schermerhorn, Gwen Her, MD Follow up in 2 week(s).   Specialty:  Obstetrics and Gynecology Why:  post op  Contact information: [address] [place] [address] [phone]           Signed: Gwen Her Schermerhorn 10/28/2017, 9:05 AM

## 2017-10-28 NOTE — Plan of Care (Addendum)
Afeb. VSS. Color good, skin w&d. BBS clear. Alert and oriented with pleasant affect. Vaginal Packing that extends appx. 4 inches from vagina is saturated but Pt. Has had small to moderate amount of drainage on Chux X 2 tonight. M.D. Is aware. Foley catheter is draining clear amber urine. Foley to be D/C'd at 0600 as per M.D. Order. Pt. Has denied pain except with movement and refuses pain medicine stating she wants to wants to wait and see how she feels after the vaginal packing is removed. Tolerating a regular diet.

## 2017-10-28 NOTE — Progress Notes (Signed)
Patient discharged home with family. Discharge instructions and prescriptions given and reviewed with patient. Patient verbalized understanding. Escorted out by auxillary.

## 2017-10-28 NOTE — Progress Notes (Signed)
Patient voided 321ml with noted relief of pressure after void. Post-void bladder scan 220 ml. Urecholine just given due to delay with pharmacy. Will try voiding trial again since medication just given.

## 2017-10-28 NOTE — Progress Notes (Signed)
Pt has passed her urine voiding trial . I will add urecholine 5 mg tid  For 5 days .  D/c home

## 2017-12-19 ENCOUNTER — Encounter: Payer: Self-pay | Admitting: Emergency Medicine

## 2017-12-19 ENCOUNTER — Emergency Department
Admission: EM | Admit: 2017-12-19 | Discharge: 2017-12-19 | Disposition: A | Discharge status: Home / Self Care | Payer: Commercial Managed Care - HMO | Attending: Emergency Medicine | Admitting: Emergency Medicine

## 2017-12-19 ENCOUNTER — Emergency Department: Payer: Commercial Managed Care - HMO

## 2017-12-19 ENCOUNTER — Other Ambulatory Visit: Payer: Self-pay

## 2017-12-19 DIAGNOSIS — Z79899 Other long term (current) drug therapy: Secondary | ICD-10-CM | POA: Insufficient documentation

## 2017-12-19 DIAGNOSIS — Z87891 Personal history of nicotine dependence: Secondary | ICD-10-CM | POA: Diagnosis not present

## 2017-12-19 DIAGNOSIS — M25512 Pain in left shoulder: Secondary | ICD-10-CM | POA: Insufficient documentation

## 2017-12-19 DIAGNOSIS — M5410 Radiculopathy, site unspecified: Secondary | ICD-10-CM

## 2017-12-19 LAB — CBC
HCT: 44.8 % (ref 36.0–46.0)
HEMOGLOBIN: 15.2 g/dL — AB (ref 12.0–15.0)
MCH: 29.5 pg (ref 26.0–34.0)
MCHC: 33.9 g/dL (ref 30.0–36.0)
MCV: 86.8 fL (ref 80.0–100.0)
NRBC: 0 % (ref 0.0–0.2)
Platelets: 213 10*3/uL (ref 150–400)
RBC: 5.16 MIL/uL — AB (ref 3.87–5.11)
RDW: 13.2 % (ref 11.5–15.5)
WBC: 5 10*3/uL (ref 4.0–10.5)

## 2017-12-19 LAB — BASIC METABOLIC PANEL
ANION GAP: 8 (ref 5–15)
BUN: 14 mg/dL (ref 6–20)
CALCIUM: 9.6 mg/dL (ref 8.9–10.3)
CO2: 25 mmol/L (ref 22–32)
Chloride: 107 mmol/L (ref 98–111)
Creatinine, Ser: 0.74 mg/dL (ref 0.44–1.00)
GFR calc Af Amer: >60 mL/min (ref >60)
GFR calc non Af Amer: >60 mL/min (ref >60)
GLUCOSE: 105 mg/dL — AB (ref 70–99)
Potassium: 4.2 mmol/L (ref 3.5–5.1)
Sodium: 140 mmol/L (ref 135–145)

## 2017-12-19 LAB — TROPONIN I: Troponin I: <0.03 ng/mL (ref <0.03)

## 2017-12-19 MED ORDER — PREDNISONE 20 MG PO TABS
60.0000 mg | ORAL_TABLET | Freq: Once | ORAL | Status: DC
  Filled 2017-12-19: qty 3

## 2017-12-19 MED ORDER — METHYLPREDNISOLONE 4 MG PO TBPK: ORAL_TABLET | ORAL | 0 refills | Status: DC

## 2017-12-19 NOTE — ED Notes (Signed)
Pt declined DC VS  No peripheral IV placed this visit.   Discharge instructions reviewed with patient. Questions fielded by this RN. Patient verbalizes understanding of instructions. Patient discharged home in stable condition per brown. No acute distress noted at time of discharge.

## 2017-12-19 NOTE — ED Notes (Signed)
Patient transported to X-ray 

## 2017-12-19 NOTE — Discharge Instructions (Addendum)
I suspect that this is most likely a pinched nerve although it is difficult to tell for sure.  We do asked that you take the medication as prescribed as this may decrease the inflammation and help you feel better.  If you have numbness, weakness, chest pain shortness of breath or worsening symptoms return to the emergency room.  Follow closely with Dr. Rudene Christians as an outpatient in the next few days.

## 2017-12-19 NOTE — ED Triage Notes (Addendum)
PT to ED via POV with c/o LFT shoulder pain radiating into her neck x2wks ago. Denies any acute injury. PT states " I just feel weird". PT states pain is radiating into her LFT jaw as well and feeling" weird and numbness" bu t has been going on for a several days intermittently. Speech clear

## 2017-12-19 NOTE — ED Notes (Signed)
Pt c/o left shoulder and neck pain intermittent over last month.  Unlabored. Skin color WNL.  Pain has been worse over last week. Afebrile.  Pt just keeps stating "it feels weird like after a shot at the dentist". NAD

## 2017-12-19 NOTE — ED Provider Notes (Addendum)
[facility] Emergency Department Provider Note  ____________________________________________   I have reviewed the triage vital signs and the nursing notes. Where available I have reviewed prior notes and, if possible and indicated, outside hospital notes.    HISTORY  Chief Complaint Shoulder Pain    HPI Alexa Garcia is a 52 y.o. female  Who presents today complaining of a pain in her left shoulder which is been there for weeks, nearly a month, but got worse recently.  It is very positional.  Is worse when she lies on her left side, she has no numbness or weakness.  It seems that the pain radiates towards the front of her shoulder and sometimes towards the front of her neck but it is very hard for her to precisely designate where it hurts.  She states it "hurts all over there".  The only thing that really makes it worse is lying on it or sometimes moving her arm the wrong way.  It is never gone.  Is been there constantly for the last several weeks.  She denies any numbness or weakness or difficulty with her dexterity of either arm.  She has had no numbness or weakness in the face.  She denies any exertional symptoms chest pain shortness of breath.  She has no abdominal pain, or any fall.  States she has a known history of disc disease, she has had no recent trauma, is very hard for her to characterize the pain.  Sometimes it does hurt sometimes it just feels "weird" but is never completely gone.  She is had no swelling to the arm itself.  She has no history of PE or DVT.  She does have a history of "disc disease" but she thinks is mostly in her lower back.  She does follow with Dr. Rudene Christians of orthopedic surgery. He does not want any pain medication at this time   Past Medical History:  Diagnosis Date  . Anxiety   . Arthritis    BACK  . Complication of anesthesia    MASK MADE PT VERY ANXIOUS AND SHE FELT [place]  . GERD (gastroesophageal reflux  disease)    H/O  . Headache    H/O migranes  . Hyperlipidemia   . PONV (postoperative nausea and vomiting)     Patient Active Problem List   Diagnosis Date Noted  . Postoperative state 10/27/2017    Past Surgical History:  Procedure Laterality Date  . ABDOMINAL HYSTERECTOMY    . ANTERIOR AND POSTERIOR REPAIR N/A 10/27/2017   Procedure: ANTERIOR (CYSTOCELE) AND POSTERIOR REPAIR (RECTOCELE);  Surgeon: Schermerhorn, Gwen Her, MD;  Location: [facility] ORS;  Service: Gynecology;  Laterality: N/A;  . APPENDECTOMY    . BUNIONECTOMY    . CARPAL TUNNEL RELEASE Left 10/08/2016   Procedure: CARPAL TUNNEL RELEASE;  Surgeon: Hessie Knows, MD;  Location: [facility] ORS;  Service: Orthopedics;  Laterality: Left;  . CARPAL TUNNEL RELEASE Right 07/24/2017   Procedure: CARPAL TUNNEL RELEASE;  Surgeon: Hessie Knows, MD;  Location: [facility] ORS;  Service: Orthopedics;  Laterality: Right;  . COLONOSCOPY N/A 02/14/2015   Procedure: COLONOSCOPY;  Surgeon: Lollie Sails, MD;  Location: [facility] ENDOSCOPY;  Service: Endoscopy;  Laterality: N/A;  . HAMMER TOE SURGERY    . HARDWARE REMOVAL Left 10/08/2016   Procedure: HARDWARE REMOVAL;  Surgeon: Hessie Knows, MD;  Location: [facility] ORS;  Service: Orthopedics;  Laterality: Left;  . OPEN REDUCTION INTERNAL FIXATION (ORIF) DISTAL RADIAL FRACTURE Left 04/04/2016  Procedure: OPEN REDUCTION INTERNAL FIXATION (ORIF) DISTAL RADIAL FRACTURE,  CLOSED REDUCTION PINNING OF DISTAL RADIAL ULNAR JOINT;  Surgeon: Hessie Knows, MD;  Location: [facility] ORS;  Service: Orthopedics;  Laterality: Left;    Prior to Admission medications   Medication Sig Start Date End Date Taking? Authorizing Provider  bethanechol (URECHOLINE) 5 MG tablet Take 1 tablet (5 mg total) by mouth 3 (three) times daily. 10/28/17   Schermerhorn, Gwen Her, MD  cetirizine (ZYRTEC) 10 MG tablet Take 10 mg by mouth daily as needed for allergies.    [provider]  docusate sodium (COLACE) 100 MG capsule Take 1 capsule  (100 mg total) by mouth daily as needed. 10/28/17 10/28/18  Schermerhorn, Gwen Her, MD  fluticasone (FLONASE) 50 MCG/ACT nasal spray Place 1-2 sprays into both nostrils daily as needed (for allergies.).     [provider]  HYDROcodone-acetaminophen (NORCO/VICODIN) 5-325 MG tablet Take 1-2 tablets by mouth every 4 (four) hours as needed for moderate pain. 10/28/17   Schermerhorn, Gwen Her, MD  ibuprofen (ADVIL,MOTRIN) 600 MG tablet Take 1 tablet (600 mg total) by mouth every 6 (six) hours as needed for fever or headache. 10/28/17   Schermerhorn, Gwen Her, MD  Multiple Vitamin (MULTIVITAMIN WITH MINERALS) TABS tablet Take 3 tablets by mouth daily.     [provider]  ondansetron (ZOFRAN) 8 MG tablet Take 1 tablet (8 mg total) by mouth every 8 (eight) hours as needed for nausea or vomiting. 10/28/17   Schermerhorn, Gwen Her, MD    Allergies Other  Family History  Problem Relation Age of Onset  . Breast cancer Neg Hx     Social History Social History   Tobacco Use  . Smoking status: Former Smoker    Packs/day: 1.00    Years: 15.00    Pack years: 15.00    Types: Cigarettes    Last attempt to quit: 04/01/2001    Years since quitting: 16.7  . Smokeless tobacco: Never Used  Substance Use Topics  . Alcohol use: Yes    Comment: SOCIAL  . Drug use: No    Review of Systems Constitutional: No fever/chills Eyes: No visual changes. ENT: No sore throat. No stiff neck no neck pain Cardiovascular: Denies chest pain. Respiratory: Denies shortness of breath. Gastrointestinal:   no vomiting.  No diarrhea.  No constipation. Genitourinary: Negative for dysuria. Musculoskeletal: Negative lower extremity swelling Skin: Negative for rash. Neurological: Negative for severe headaches, focal weakness or numbness.   ____________________________________________   PHYSICAL EXAM:  VITAL SIGNS: ED Triage Vitals  Enc Vitals Group     BP 12/19/17 1802 132/71     Pulse Rate 12/19/17  1802 78     Resp 12/19/17 1802 18     Temp 12/19/17 1802 97.9 F (36.6 C)     Temp Source 12/19/17 1802 Oral     SpO2 12/19/17 1802 100 %     Weight 12/19/17 1802 200 lb (90.7 kg)     Height 12/19/17 1802 5\' 4"  (1.626 m)     Head Circumference --      Peak Flow --      Pain Score 12/19/17 1814 2     Pain Loc --      Pain Edu? --      Excl. in [place]? --     Constitutional: Alert and oriented. Well appearing and in no acute distress.  Mildly anxious Eyes: Conjunctivae are normal Head: Atraumatic HEENT: No congestion/rhinnorhea. Mucous membranes are moist.  Oropharynx non-erythematous  Neck: There is tenderness to palpation to some degree in the trapezius muscle and above the left scaphoid, also minimal tenderness to palpation diffusely in the area of the left shoulder which reproduces her discomfort.  She can flex and extend her neck with no difficulty.  She has strong distal pulses, 2+ radial and ulnar, she has soft compartments, she has intact strength and sensation bilateral arms, she has no evidence of fracture deformity or bruising.  The shoulder is not hot to touch or red, there is no midline neck tenderness.  There is no shingles or herpetic lesions noted. Cardiovascular: Normal rate, regular rhythm. Grossly normal heart sounds.  Good peripheral circulation. Respiratory: Normal respiratory effort.  No retractions. Lungs CTAB. Abdominal: Soft and nontender. No distention. No guarding no rebound Back:  There is no focal tenderness or step off.  there is no midline tenderness there are no lesions noted. there is no CVA tenderness Musculoskeletal: See above,No joint effusions, no DVT signs strong distal pulses no edema Neurologic:  Normal speech and language. No gross focal neurologic deficits are appreciated.  Radial nerves II through XII grossly intact no focal neurologic deficit noted. Skin:  Skin is warm, dry and intact. No rash noted. Psychiatric: Mood and affect are normal. Speech and  behavior are normal.  ____________________________________________   LABS (all labs ordered are listed, but only abnormal results are displayed)  Labs Reviewed  BASIC METABOLIC PANEL - Abnormal; Notable for the following components:      Result Value   Glucose, Bld 105 (*)    All other components within normal limits  CBC - Abnormal; Notable for the following components:   RBC 5.16 (*)    Hemoglobin 15.2 (*)    All other components within normal limits  TROPONIN I    Pertinent labs  results that were available during my care of the patient were reviewed by me and considered in my medical decision making (see chart for details). ____________________________________________  EKG  I personally interpreted any EKGs ordered by me or triage Sinus rhythm RSR prime configuration, no acute ST elevation or depression, nonspecific ST changes consistent with a right bundle branch block no acute ischemia ____________________________________________  RADIOLOGY  Pertinent labs & imaging results that were available during my care of the patient were reviewed by me and considered in my medical decision making (see chart for details). If possible, patient and/or family made aware of any abnormal findings.  Dg Chest 2 View  Result Date: 12/19/2017 CLINICAL DATA:  Left shoulder pain radiating to the left arm and left side of neck. EXAM: CHEST - 2 VIEW COMPARISON:  None. FINDINGS: Heart size is normal. Nonaneurysmal thoracic aorta. Confluent airspace disease at the left lung base suspicious for pneumonia in the lingular distribution. No effusion or pneumothorax. No acute osseous abnormality. Mild degenerative change along the thoracic spine. IMPRESSION: Lingular airspace opacity suspicious for pneumonia. Electronically Signed   By: Ashley Royalty M.D.   On: 12/19/2017 18:58   Dg Shoulder Left  Result Date: 12/19/2017 CLINICAL DATA:  Left shoulder pain for the past month.  No injury. EXAM: LEFT  SHOULDER - 2+ VIEW COMPARISON:  None. FINDINGS: No acute fracture or dislocation. Joint spaces are preserved. Small 3 mm focus of globular calcification near the greater tuberosity. Bone mineralization is normal. Soft tissues are unremarkable. IMPRESSION: 1. Small focus of calcification near the greater tuberosity could represent calcific tendinitis. 2.  No acute osseous abnormality. Electronically Signed   By: Titus Dubin  M.D.   On: 12/19/2017 19:27   ____________________________________________    PROCEDURES  Procedure(s) performed: None  Procedures  Critical Care performed: None  ____________________________________________   INITIAL IMPRESSION / ASSESSMENT AND PLAN / ED COURSE  Pertinent labs & imaging results that were available during my care of the patient were reviewed by me and considered in my medical decision making (see chart for details).  Patient here with very reproducible chronic, months long discomfort in her left shoulder which is positional.  Very reassuring exam.  Broad differential.  Certainly could be some aspect of impingement syndrome, could be a radiculopathy, there is no evidence however of compartment syndrome DVT referred abdominal pain, primary pulmonary pathology, ACS PE or dissection, myocarditis endocarditis pericarditis pneumonia pneumothorax, there is no evidence of a significant radiculopathy requiring emergent intervention, and her strength and sensation is completely intact.  Simply could be from lying on her shoulder at night repeatedly.  This is how she sleeps lying on that shoulder and sometimes she has to interrupt her sleep to transposition.  She has a pillow that she thinks does not worsen her symptoms but I have advised her to do consider changing that.  I also advised her to consider changing her sleep pattern.  At this time there is no acute pathology that I think requires emergent admission or further work-up.  Troponin is negative despite  reproducible pain for 1 month.  I do not think serial enzymes will be of utility.  I may give her a short dose of steroids to see if this helps calm down the inflammation and we will have her follow-up with her orthopedic surgeon.  ----------------------------------------- 7:46 PM on 12/19/2017 -----------------------------------------  Neurologically intact still, we did discuss steroids, I did order some for her but she refuses to take them she states she does not want to take any pills including pain pills.  This is certainly her choice, she has been given extensive return precautions and understands what to look out for, she will follow with her orthopedic surgeon who also has a back doctor soon.   ____________________________________________   FINAL CLINICAL IMPRESSION(S) / ED DIAGNOSES  Final diagnoses:  None      This chart was dictated using voice recognition software.  Despite best efforts to proofread,  errors can occur which can change meaning.        Schuyler Amor, MD 12/19/17 1936    Schuyler Amor, MD 12/19/17 1946

## 2018-01-16 ENCOUNTER — Other Ambulatory Visit: Payer: Self-pay | Admitting: Sports Medicine

## 2018-01-16 DIAGNOSIS — M25512 Pain in left shoulder: Principal | ICD-10-CM

## 2018-01-16 DIAGNOSIS — G8929 Other chronic pain: Secondary | ICD-10-CM

## 2018-01-16 DIAGNOSIS — M7532 Calcific tendinitis of left shoulder: Secondary | ICD-10-CM

## 2018-01-16 DIAGNOSIS — M47812 Spondylosis without myelopathy or radiculopathy, cervical region: Secondary | ICD-10-CM

## 2018-01-26 ENCOUNTER — Ambulatory Visit
Admission: EM | Admit: 2018-01-26 | Discharge: 2018-01-26 | Disposition: A | Discharge status: Home / Self Care | Payer: Commercial Managed Care - HMO | Attending: Family Medicine | Admitting: Family Medicine

## 2018-01-26 ENCOUNTER — Other Ambulatory Visit: Payer: Self-pay

## 2018-01-26 DIAGNOSIS — J0100 Acute maxillary sinusitis, unspecified: Secondary | ICD-10-CM | POA: Diagnosis not present

## 2018-01-26 MED ORDER — AMOXICILLIN 875 MG PO TABS: 875.0000 mg | ORAL_TABLET | Freq: Two times a day (BID) | ORAL | 0 refills | Status: DC

## 2018-01-26 NOTE — ED Provider Notes (Signed)
[facility]    CSN: [id number] Arrival date & time: 01/26/18  1514     History   Chief Complaint Chief Complaint  Patient presents with  . Sinus Problem    HPI Alexa Garcia is a 52 y.o. female.   The history is provided by the patient.  Sinus Problem  Associated symptoms include headaches.  URI  Presenting symptoms: congestion, facial pain and fatigue   Severity:  Moderate Onset quality:  Sudden Duration:  10 days Timing:  Constant Progression:  Worsening Chronicity:  New Relieved by:  Nothing Ineffective treatments:  OTC medications Associated symptoms: headaches and sinus pain   Associated symptoms: no wheezing   Risk factors: sick contacts   Risk factors: not elderly, no chronic cardiac disease, no chronic kidney disease, no chronic respiratory disease, no diabetes mellitus, no immunosuppression, no recent illness and no recent travel     Past Medical History:  Diagnosis Date  . Anxiety   . Arthritis    BACK  . Complication of anesthesia    MASK MADE PT VERY ANXIOUS AND SHE FELT [place]  . GERD (gastroesophageal reflux disease)    H/O  . Headache    H/O migranes  . Hyperlipidemia   . PONV (postoperative nausea and vomiting)     Patient Active Problem List   Diagnosis Date Noted  . Postoperative state 10/27/2017    Past Surgical History:  Procedure Laterality Date  . ABDOMINAL HYSTERECTOMY    . ANTERIOR AND POSTERIOR REPAIR N/A 10/27/2017   Procedure: ANTERIOR (CYSTOCELE) AND POSTERIOR REPAIR (RECTOCELE);  Surgeon: Schermerhorn, Gwen Her, MD;  Location: [facility] ORS;  Service: Gynecology;  Laterality: N/A;  . APPENDECTOMY    . BUNIONECTOMY    . CARPAL TUNNEL RELEASE Left 10/08/2016   Procedure: CARPAL TUNNEL RELEASE;  Surgeon: Hessie Knows, MD;  Location: [facility] ORS;  Service: Orthopedics;  Laterality: Left;  . CARPAL TUNNEL RELEASE Right 07/24/2017   Procedure: CARPAL TUNNEL RELEASE;  Surgeon: Hessie Knows, MD;   Location: [facility]S;  Service: Orthopedics;  Laterality: Right;  . COLONOSCOPY N/A 02/14/2015   Procedure: COLONOSCOPY;  Surgeon: Lollie Sails, MD;  Location: [facility] ENDOSCOPY;  Service: Endoscopy;  Laterality: N/A;  . HAMMER TOE SURGERY    . HARDWARE REMOVAL Left 10/08/2016   Procedure: HARDWARE REMOVAL;  Surgeon: Hessie Knows, MD;  Location: [facility] ORS;  Service: Orthopedics;  Laterality: Left;  . OPEN REDUCTION INTERNAL FIXATION (ORIF) DISTAL RADIAL FRACTURE Left 04/04/2016   Procedure: OPEN REDUCTION INTERNAL FIXATION (ORIF) DISTAL RADIAL FRACTURE,  CLOSED REDUCTION PINNING OF DISTAL RADIAL ULNAR JOINT;  Surgeon: Hessie Knows, MD;  Location: [facility] ORS;  Service: Orthopedics;  Laterality: Left;    OB History   None      Home Medications    Prior to Admission medications   Medication Sig Start Date End Date Taking? Authorizing Provider  cetirizine (ZYRTEC) 10 MG tablet Take 10 mg by mouth daily as needed for allergies.   Yes [provider]  amoxicillin (AMOXIL) 875 MG tablet Take 1 tablet (875 mg total) by mouth 2 (two) times daily. 01/26/18   Norval Gable, MD  bethanechol (URECHOLINE) 5 MG tablet Take 1 tablet (5 mg total) by mouth 3 (three) times daily. 10/28/17   Schermerhorn, Gwen Her, MD  docusate sodium (COLACE) 100 MG capsule Take 1 capsule (100 mg total) by mouth daily as needed. 10/28/17 10/28/18  Schermerhorn, Gwen Her, MD  fluticasone (FLONASE) 50 MCG/ACT nasal spray Place 1-2 sprays  into both nostrils daily as needed (for allergies.).     [provider]  HYDROcodone-acetaminophen (NORCO/VICODIN) 5-325 MG tablet Take 1-2 tablets by mouth every 4 (four) hours as needed for moderate pain. 10/28/17   Schermerhorn, Gwen Her, MD  ibuprofen (ADVIL,MOTRIN) 600 MG tablet Take 1 tablet (600 mg total) by mouth every 6 (six) hours as needed for fever or headache. 10/28/17   Schermerhorn, Gwen Her, MD  methylPREDNISolone (MEDROL) 4 MG TBPK tablet 5 pills daily for the first  2 days, then 4 pills for 2 days then 3 pills for 2 days then 1 pill for 2 days 12/19/17   Schuyler Amor, MD  Multiple Vitamin (MULTIVITAMIN WITH MINERALS) TABS tablet Take 3 tablets by mouth daily.     [provider]  ondansetron (ZOFRAN) 8 MG tablet Take 1 tablet (8 mg total) by mouth every 8 (eight) hours as needed for nausea or vomiting. 10/28/17   Schermerhorn, Gwen Her, MD    Family History Family History  Problem Relation Age of Onset  . Cancer Mother   . Heart attack Father   . Breast cancer Neg Hx     Social History Social History   Tobacco Use  . Smoking status: Former Smoker    Packs/day: 1.00    Years: 15.00    Pack years: 15.00    Types: Cigarettes    Last attempt to quit: 04/01/2001    Years since quitting: 16.8  . Smokeless tobacco: Never Used  Substance Use Topics  . Alcohol use: Yes    Comment: SOCIAL  . Drug use: No     Allergies   Other   Review of Systems Review of Systems  Constitutional: Positive for fatigue.  HENT: Positive for congestion and sinus pain.   Respiratory: Negative for wheezing.   Neurological: Positive for headaches.     Physical Exam Triage Vital Signs ED Triage Vitals  Enc Vitals Group     BP 01/26/18 1534 (!) 141/82     Pulse Rate 01/26/18 1534 93     Resp 01/26/18 1534 18     Temp 01/26/18 1534 98.3 F (36.8 C)     Temp Source 01/26/18 1534 Oral     SpO2 01/26/18 1534 98 %     Weight 01/26/18 1532 203 lb (92.1 kg)     Height 01/26/18 1532 5\' 4"  (1.626 m)     Head Circumference --      Peak Flow --      Pain Score 01/26/18 1532 9     Pain Loc --      Pain Edu? --      Excl. in [place]? --    No data found.  Updated Vital Signs BP (!) 141/82 (BP Location: Left Arm)   Pulse 93   Temp 98.3 F (36.8 C) (Oral)   Resp 18   Ht 5\' 4"  (1.626 m)   Wt 92.1 kg   SpO2 98%   BMI 34.84 kg/m   Visual Acuity Right Eye Distance:   Left Eye Distance:   Bilateral Distance:    Right Eye Near:   Left Eye Near:     Bilateral Near:     Physical Exam  Constitutional: She appears well-developed and well-nourished. No distress.  HENT:  Head: Normocephalic and atraumatic.  Right Ear: Tympanic membrane, external ear and ear canal normal.  Left Ear: Tympanic membrane, external ear and ear canal normal.  Nose: Mucosal edema and rhinorrhea present. No nose lacerations, sinus  tenderness, nasal deformity, septal deviation or nasal septal hematoma. No epistaxis.  No foreign bodies. Right sinus exhibits maxillary sinus tenderness and frontal sinus tenderness. Left sinus exhibits maxillary sinus tenderness and frontal sinus tenderness.  Mouth/Throat: Uvula is midline, oropharynx is clear and moist and mucous membranes are normal. No oropharyngeal exudate.  Eyes: Pupils are equal, round, and reactive to light. Conjunctivae and EOM are normal. Right eye exhibits no discharge. Left eye exhibits no discharge. No scleral icterus.  Neck: Normal range of motion. Neck supple. No thyromegaly present.  Cardiovascular: Normal rate, regular rhythm and normal heart sounds.  Pulmonary/Chest: Effort normal and breath sounds normal. No respiratory distress. She has no wheezes. She has no rales.  Lymphadenopathy:    She has no cervical adenopathy.  Skin: She is not diaphoretic.  Nursing note and vitals reviewed.    UC Treatments / Results  Labs (all labs ordered are listed, but only abnormal results are displayed) Labs Reviewed - No data to display  EKG None  Radiology No results found.  Procedures Procedures (including critical care time)  Medications Ordered in UC Medications - No data to display  Initial Impression / Assessment and Plan / UC Course  I have reviewed the triage vital signs and the nursing notes.  Pertinent labs & imaging results that were available during my care of the patient were reviewed by me and considered in my medical decision making (see chart for details).      Final Clinical  Impressions(s) / UC Diagnoses   Final diagnoses:  Acute maxillary sinusitis, recurrence not specified    ED Prescriptions    Medication Sig Dispense Auth. Provider   amoxicillin (AMOXIL) 875 MG tablet Take 1 tablet (875 mg total) by mouth 2 (two) times daily. 20 tablet Norval Gable, MD     1. diagnosis reviewed with patient 2. rx as per orders above; reviewed possible side effects, interactions, risks and benefits  3. Recommend supportive treatment with otc flonase 4. Follow-up prn if symptoms worsen or don't improve   Controlled Substance Prescriptions [place] Controlled Substance Registry consulted? Not Applicable   Norval Gable, MD 01/26/18 [phone]

## 2018-01-26 NOTE — ED Triage Notes (Signed)
Patient complains of sinus pain and pressure, body aches, nasal drainage, slight cough x 2 weeks. Patient states that she thought she was improving but worsened again.

## 2018-02-01 ENCOUNTER — Ambulatory Visit: Payer: 59

## 2018-02-01 ENCOUNTER — Emergency Department
Admission: EM | Admit: 2018-02-01 | Discharge: 2018-02-01 | Disposition: A | Discharge status: Home / Self Care | Payer: Commercial Managed Care - HMO | Attending: Emergency Medicine | Admitting: Emergency Medicine

## 2018-02-01 ENCOUNTER — Encounter: Payer: Self-pay | Admitting: Emergency Medicine

## 2018-02-01 DIAGNOSIS — R030 Elevated blood-pressure reading, without diagnosis of hypertension: Secondary | ICD-10-CM | POA: Insufficient documentation

## 2018-02-01 DIAGNOSIS — Z79899 Other long term (current) drug therapy: Secondary | ICD-10-CM | POA: Diagnosis not present

## 2018-02-01 DIAGNOSIS — Z87891 Personal history of nicotine dependence: Secondary | ICD-10-CM | POA: Diagnosis not present

## 2018-02-01 NOTE — ED Notes (Signed)
Reviewed discharge instructions, follow-up care, and blood pressure log with patient. Patient verbalized understanding of all information reviewed. Patient stable, with no distress noted at this time.

## 2018-02-01 NOTE — ED Provider Notes (Signed)
[facility] Emergency Department Provider Note   ____________________________________________    I have reviewed the triage vital signs and the nursing notes.   HISTORY  Chief Complaint Hypertension     HPI Alexa Garcia is a 52 y.o. female presents because of an elevated blood pressure reading at a pharmacy today.  Patient reports that she has been checking her blood pressure over the last several days with her home blood pressure cuff, she has had a couple of elevated readings in the high 140s over 90s.  Today at the pharmacy she used an automated system there her blood pressure was 150/90 and she became concerned and decided that she need to be seen in the emergency department.  She has no chest pain.  No headache.  No shortness of breath.  She feels quite well.   Past Medical History:  Diagnosis Date  . Anxiety   . Arthritis    BACK  . Complication of anesthesia    MASK MADE PT VERY ANXIOUS AND SHE FELT [place]  . GERD (gastroesophageal reflux disease)    H/O  . Headache    H/O migranes  . Hyperlipidemia   . PONV (postoperative nausea and vomiting)     Patient Active Problem List   Diagnosis Date Noted  . Postoperative state 10/27/2017    Past Surgical History:  Procedure Laterality Date  . ABDOMINAL HYSTERECTOMY    . ANTERIOR AND POSTERIOR REPAIR N/A 10/27/2017   Procedure: ANTERIOR (CYSTOCELE) AND POSTERIOR REPAIR (RECTOCELE);  Surgeon: Schermerhorn, Gwen Her, MD;  Location: [facility] ORS;  Service: Gynecology;  Laterality: N/A;  . APPENDECTOMY    . BUNIONECTOMY    . CARPAL TUNNEL RELEASE Left 10/08/2016   Procedure: CARPAL TUNNEL RELEASE;  Surgeon: Hessie Knows, MD;  Location: [facility] OR[facility];  Service: Orthopedics;  Laterality: Left;  . CARPAL TUNNEL RELEASE Right 07/24/2017   Procedure: CARPAL TUNNEL RELEASE;  Surgeon: Hessie Knows, MD;  Location: [facility] ORS;  Service: Orthopedics;  Laterality: Right;  . COLONOSCOPY N/A  02/14/2015   Procedure: COLONOSCOPY;  Surgeon: Lollie Sails, MD;  Location: [facility] ENDOSCOPY;  Service: Endoscopy;  Laterality: N/A;  . HAMMER TOE SURGERY    . HARDWARE REMOVAL Left 10/08/2016   Procedure: HARDWARE REMOVAL;  Surgeon: Hessie Knows, MD;  Location: [facility] ORS;  Service: Orthopedics;  Laterality: Left;  . OPEN REDUCTION INTERNAL FIXATION (ORIF) DISTAL RADIAL FRACTURE Left 04/04/2016   Procedure: OPEN REDUCTION INTERNAL FIXATION (ORIF) DISTAL RADIAL FRACTURE,  CLOSED REDUCTION PINNING OF DISTAL RADIAL ULNAR JOINT;  Surgeon: Hessie Knows, MD;  Location: [facility] ORS;  Service: Orthopedics;  Laterality: Left;    Prior to Admission medications   Medication Sig Start Date End Date Taking? Authorizing Provider  amoxicillin (AMOXIL) 875 MG tablet Take 1 tablet (875 mg total) by mouth 2 (two) times daily. 01/26/18   Norval Gable, MD  bethanechol (URECHOLINE) 5 MG tablet Take 1 tablet (5 mg total) by mouth 3 (three) times daily. 10/28/17   Schermerhorn, Gwen Her, MD  cetirizine (ZYRTEC) 10 MG tablet Take 10 mg by mouth daily as needed for allergies.    [provider]  docusate sodium (COLACE) 100 MG capsule Take 1 capsule (100 mg total) by mouth daily as needed. 10/28/17 10/28/18  Schermerhorn, Gwen Her, MD  fluticasone (FLONASE) 50 MCG/ACT nasal spray Place 1-2 sprays into both nostrils daily as needed (for allergies.).     [provider]  HYDROcodone-acetaminophen (NORCO/VICODIN) 5-325 MG tablet Take 1-2  tablets by mouth every 4 (four) hours as needed for moderate pain. 10/28/17   Schermerhorn, Gwen Her, MD  ibuprofen (ADVIL,MOTRIN) 600 MG tablet Take 1 tablet (600 mg total) by mouth every 6 (six) hours as needed for fever or headache. 10/28/17   Schermerhorn, Gwen Her, MD  methylPREDNISolone (MEDROL) 4 MG TBPK tablet 5 pills daily for the first 2 days, then 4 pills for 2 days then 3 pills for 2 days then 1 pill for 2 days 12/19/17   Schuyler Amor, MD  Multiple Vitamin  (MULTIVITAMIN WITH MINERALS) TABS tablet Take 3 tablets by mouth daily.     [provider]  ondansetron (ZOFRAN) 8 MG tablet Take 1 tablet (8 mg total) by mouth every 8 (eight) hours as needed for nausea or vomiting. 10/28/17   Schermerhorn, Gwen Her, MD     Allergies Other  Family History  Problem Relation Age of Onset  . Cancer Mother   . Heart attack Father   . Breast cancer Neg Hx     Social History Social History   Tobacco Use  . Smoking status: Former Smoker    Packs/day: 1.00    Years: 15.00    Pack years: 15.00    Types: Cigarettes    Last attempt to quit: 04/01/2001    Years since quitting: 16.8  . Smokeless tobacco: Never Used  Substance Use Topics  . Alcohol use: Yes    Comment: SOCIAL  . Drug use: No    Review of Systems  Constitutional: No dizziness  Cardiovascular: Denies chest pain. Respiratory: Denies shortness of breath. Gastrointestinal: No abdominal pain.     Neurological: Negative for headaches or weakness   ____________________________________________   PHYSICAL EXAM:  VITAL SIGNS: ED Triage Vitals  Enc Vitals Group     BP 02/01/18 2150 (!) 146/84     Pulse Rate 02/01/18 2150 91     Resp 02/01/18 2150 18     Temp 02/01/18 2150 98.4 F (36.9 C)     Temp Source 02/01/18 2150 Oral     SpO2 02/01/18 2150 100 %     Weight 02/01/18 2151 91.6 kg (202 lb)     Height 02/01/18 2151 1.626 m (5\' 4" )     Head Circumference --      Peak Flow --      Pain Score 02/01/18 2151 0     Pain Loc --      Pain Edu? --      Excl. in [place]? --     Constitutional: Alert and oriented. No acute distress. Pleasant and interactive   Cardiovascular: Normal rate, regular rhythm. Grossly normal heart sounds.  Good peripheral circulation. Respiratory: Normal respiratory effort.  No retractions. Lungs CTAB. Gastrointestinal: Soft and nontender. No distention  Musculoskeletal: No lower extremity tenderness nor edema.  Warm and well  perfused Neurologic:  Normal speech and language. No gross focal neurologic deficits are appreciated.  Skin:  Skin is warm, dry and intact. No rash noted. Psychiatric: Mood and affect are normal. Speech and behavior are normal.  ____________________________________________   LABS (all labs ordered are listed, but only abnormal results are displayed)  Labs Reviewed - No data to display ____________________________________________  EKG  None ____________________________________________  RADIOLOGY  None ____________________________________________   PROCEDURES  Procedure(s) performed: No  Procedures   Critical Care performed: No ____________________________________________   INITIAL IMPRESSION / ASSESSMENT AND PLAN / ED COURSE  Pertinent labs & imaging results that were available during my care of  the patient were reviewed by me and considered in my medical decision making (see chart for details).  Patient well-appearing in no acute distress.  Blood pressure here in the emergency department is 126/77.  Normal reassuring exam.  Reassurance provided, recommend outpatient follow-up with PCP    ____________________________________________   FINAL CLINICAL IMPRESSION(S) / ED DIAGNOSES  Final diagnoses:  Elevated blood pressure reading        Note:  This document was prepared using Dragon voice recognition software and may include unintentional dictation errors.    Lavonia Drafts, MD 02/01/18 2223

## 2018-02-01 NOTE — ED Triage Notes (Signed)
Patient states that her blood pressure has been high for a couple of weeks. Patient states that she checked her bp tonight and it was 153/101.

## 2019-03-13 ENCOUNTER — Ambulatory Visit
Admission: EM | Admit: 2019-03-13 | Discharge: 2019-03-13 | Disposition: A | Discharge status: Home / Self Care | Payer: 59 | Attending: Family Medicine | Admitting: Family Medicine

## 2019-03-13 ENCOUNTER — Encounter: Payer: Self-pay | Admitting: Emergency Medicine

## 2019-03-13 ENCOUNTER — Other Ambulatory Visit: Payer: Self-pay

## 2019-03-13 DIAGNOSIS — Z79899 Other long term (current) drug therapy: Secondary | ICD-10-CM | POA: Insufficient documentation

## 2019-03-13 DIAGNOSIS — Z87891 Personal history of nicotine dependence: Secondary | ICD-10-CM | POA: Insufficient documentation

## 2019-03-13 DIAGNOSIS — M47819 Spondylosis without myelopathy or radiculopathy, site unspecified: Secondary | ICD-10-CM | POA: Insufficient documentation

## 2019-03-13 DIAGNOSIS — J029 Acute pharyngitis, unspecified: Secondary | ICD-10-CM

## 2019-03-13 DIAGNOSIS — Z7952 Long term (current) use of systemic steroids: Secondary | ICD-10-CM | POA: Diagnosis not present

## 2019-03-13 DIAGNOSIS — Z20822 Contact with and (suspected) exposure to covid-19: Secondary | ICD-10-CM | POA: Insufficient documentation

## 2019-03-13 DIAGNOSIS — Z90710 Acquired absence of both cervix and uterus: Secondary | ICD-10-CM | POA: Diagnosis not present

## 2019-03-13 DIAGNOSIS — Z9109 Other allergy status, other than to drugs and biological substances: Secondary | ICD-10-CM | POA: Insufficient documentation

## 2019-03-13 DIAGNOSIS — B349 Viral infection, unspecified: Secondary | ICD-10-CM | POA: Diagnosis present

## 2019-03-13 DIAGNOSIS — R05 Cough: Secondary | ICD-10-CM | POA: Diagnosis not present

## 2019-03-13 DIAGNOSIS — Z809 Family history of malignant neoplasm, unspecified: Secondary | ICD-10-CM | POA: Diagnosis not present

## 2019-03-13 LAB — GROUP A STREP BY PCR: Group A Strep by PCR: NOT DETECTED

## 2019-03-13 MED ORDER — BENZONATATE 200 MG PO CAPS: 200.0000 mg | ORAL_CAPSULE | Freq: Three times a day (TID) | ORAL | 0 refills | Status: DC | PRN

## 2019-03-13 MED ORDER — PREDNISONE 50 MG PO TABS: ORAL_TABLET | ORAL | 0 refills | Status: DC

## 2019-03-13 NOTE — Discharge Instructions (Signed)
Awaiting COVID test results.  Medication as directed.  Stay home.  Take care  Dr. Lacinda Axon

## 2019-03-13 NOTE — ED Triage Notes (Signed)
Patient c/o cough and sore throat for the past 2 weeks.  Patient denies fevers.

## 2019-03-14 LAB — NOVEL CORONAVIRUS, NAA (HOSP ORDER, SEND-OUT TO REF LAB; TAT 18-24 HRS): SARS-CoV-2, NAA: NOT DETECTED

## 2019-03-14 NOTE — ED Provider Notes (Signed)
[facility]    CSN: [id number] Arrival date & time: 03/13/19  1439  History   Chief Complaint Chief Complaint  Patient presents with  . Sore Throat    APPT  . Cough    HPI  54 year old female presents with the above complaints.  Patient has had ongoing symptoms for the past 2 weeks.  She states that her symptoms have been worse for the past 2 days.  She reports cough and severe sore throat.  No documented fever.  States that her pain is 9/10 in severity.  Painful swallowing.  No relieving factors.  No reported sick contacts.  No other associated symptoms.  No other complaints.  PMH, Surgical Hx, Family Hx, Social History reviewed and updated as below.  Past Medical History:  Diagnosis Date  . Anxiety   . Arthritis    BACK  . Complication of anesthesia    MASK MADE PT VERY ANXIOUS AND SHE FELT [place]  . GERD (gastroesophageal reflux disease)    H/O  . Headache    H/O migranes  . Hyperlipidemia   . PONV (postoperative nausea and vomiting)     Patient Active Problem List   Diagnosis Date Noted  . Postoperative state 10/27/2017    Past Surgical History:  Procedure Laterality Date  . ABDOMINAL HYSTERECTOMY    . ANTERIOR AND POSTERIOR REPAIR N/A 10/27/2017   Procedure: ANTERIOR (CYSTOCELE) AND POSTERIOR REPAIR (RECTOCELE);  Surgeon: Schermerhorn, Gwen Her, MD;  Location: [facility] ORS;  Service: Gynecology;  Laterality: N/A;  . APPENDECTOMY    . BUNIONECTOMY    . CARPAL TUNNEL RELEASE Left 10/08/2016   Procedure: CARPAL TUNNEL RELEASE;  Surgeon: Hessie Knows, MD;  Location: [facility] ORS;  Service: Orthopedics;  Laterality: Left;  . CARPAL TUNNEL RELEASE Right 07/24/2017   Procedure: CARPAL TUNNEL RELEASE;  Surgeon: Hessie Knows, MD;  Location: [facility] ORS;  Service: Orthopedics;  Laterality: Right;  . COLONOSCOPY N/A 02/14/2015   Procedure: COLONOSCOPY;  Surgeon: Lollie Sails, MD;  Location: [facility] ENDOSCOPY;  Service: Endoscopy;  Laterality: N/A;    . HAMMER TOE SURGERY    . HARDWARE REMOVAL Left 10/08/2016   Procedure: HARDWARE REMOVAL;  Surgeon: Hessie Knows, MD;  Location: [facility] ORS;  Service: Orthopedics;  Laterality: Left;  . OPEN REDUCTION INTERNAL FIXATION (ORIF) DISTAL RADIAL FRACTURE Left 04/04/2016   Procedure: OPEN REDUCTION INTERNAL FIXATION (ORIF) DISTAL RADIAL FRACTURE,  CLOSED REDUCTION PINNING OF DISTAL RADIAL ULNAR JOINT;  Surgeon: Hessie Knows, MD;  Location: [facility] ORS;  Service: Orthopedics;  Laterality: Left;    OB History   No obstetric history on file.      Home Medications    Prior to Admission medications   Medication Sig Start Date End Date Taking? Authorizing Provider  cetirizine (ZYRTEC) 10 MG tablet Take 10 mg by mouth daily as needed for allergies.   Yes [provider]  fluticasone (FLONASE) 50 MCG/ACT nasal spray Place 1-2 sprays into both nostrils daily as needed (for allergies.).    Yes [provider]  Multiple Vitamin (MULTIVITAMIN WITH MINERALS) TABS tablet Take 3 tablets by mouth daily.    Yes [provider]  benzonatate (TESSALON) 200 MG capsule Take 1 capsule (200 mg total) by mouth 3 (three) times daily as needed for cough. 03/13/19   Coral Spikes, DO  bethanechol (URECHOLINE) 5 MG tablet Take 1 tablet (5 mg total) by mouth 3 (three) times daily. 10/28/17   Schermerhorn, Gwen Her, MD  ondansetron [facility]) 8  MG tablet Take 1 tablet (8 mg total) by mouth every 8 (eight) hours as needed for nausea or vomiting. 10/28/17   Schermerhorn, Gwen Her, MD  predniSONE (DELTASONE) 50 MG tablet 1 tablet daily x 5 days 03/13/19   Coral Spikes, DO    Family History Family History  Problem Relation Age of Onset  . Cancer Mother   . Heart attack Father   . Breast cancer Neg Hx     Social History Social History   Tobacco Use  . Smoking status: Former Smoker    Packs/day: 1.00    Years: 15.00    Pack years: 15.00    Types: Cigarettes    Quit date: 04/01/2001    Years since  quitting: 17.9  . Smokeless tobacco: Never Used  Substance Use Topics  . Alcohol use: Yes    Comment: SOCIAL  . Drug use: No     Allergies   Other   Review of Systems Review of Systems  Constitutional: Negative for fever.  HENT: Positive for sore throat.   Respiratory: Positive for cough.    Physical Exam Triage Vital Signs ED Triage Vitals  Enc Vitals Group     BP 03/13/19 1504 129/89     Pulse Rate 03/13/19 1504 76     Resp 03/13/19 1504 16     Temp 03/13/19 1504 97.9 F (36.6 C)     Temp Source 03/13/19 1504 Oral     SpO2 03/13/19 1504 100 %     Weight 03/13/19 1500 200 lb (90.7 kg)     Height 03/13/19 1500 5\' 4"  (1.626 m)     Head Circumference --      Peak Flow --      Pain Score 03/13/19 1500 9     Pain Loc --      Pain Edu? --      Excl. in [place]? --    Updated Vital Signs BP 129/89 (BP Location: Right Arm)   Pulse 76   Temp 97.9 F (36.6 C) (Oral)   Resp 16   Ht 5\' 4"  (1.626 m)   Wt 90.7 kg   SpO2 100%   BMI 34.33 kg/m   Visual Acuity Right Eye Distance:   Left Eye Distance:   Bilateral Distance:    Right Eye Near:   Left Eye Near:    Bilateral Near:     Physical Exam Vitals and nursing note reviewed.  Constitutional:      General: She is not in acute distress.    Appearance: Normal appearance. She is not ill-appearing.  HENT:     Head: Normocephalic and atraumatic.     Mouth/Throat:     Pharynx: Posterior oropharyngeal erythema present. No oropharyngeal exudate.  Eyes:     General:        Right eye: No discharge.        Left eye: No discharge.     Conjunctiva/sclera: Conjunctivae normal.  Cardiovascular:     Rate and Rhythm: Normal rate and regular rhythm.     Heart sounds: No murmur.  Pulmonary:     Effort: Pulmonary effort is normal.     Breath sounds: Normal breath sounds. No wheezing, rhonchi or rales.  Neurological:     Mental Status: She is alert.  Psychiatric:        Mood and Affect: Mood normal.        Behavior:  Behavior normal.    UC Treatments / Results  Labs (all labs  ordered are listed, but only abnormal results are displayed) Labs Reviewed  GROUP A STREP BY PCR  NOVEL CORONAVIRUS, NAA (HOSP ORDER, SEND-OUT TO REF LAB; TAT 18-24 HRS)    EKG   Radiology No results found.  Procedures Procedures (including critical care time)  Medications Ordered in UC Medications - No data to display  Initial Impression / Assessment and Plan / UC Course  I have reviewed the triage vital signs and the nursing notes.  Pertinent labs & imaging results that were available during my care of the patient were reviewed by me and considered in my medical decision making (see chart for details).    54 year old female presents with severe pharyngitis and cough.  Strep test negative.  Awaiting Covid test results.  Treating with prednisone and Tessalon Perles.  Final Clinical Impressions(s) / UC Diagnoses   Final diagnoses:  Viral illness     Discharge Instructions     Awaiting COVID test results.  Medication as directed.  Stay home.  Take care  Dr. Lacinda Axon    ED Prescriptions    Medication Sig Dispense Auth. Provider   predniSONE (DELTASONE) 50 MG tablet 1 tablet daily x 5 days 5 tablet Calix Heinbaugh G, DO   benzonatate (TESSALON) 200 MG capsule Take 1 capsule (200 mg total) by mouth 3 (three) times daily as needed for cough. 30 capsule Coral Spikes, DO     PDMP not reviewed this encounter.   Thersa Salt [place], [place] 03/14/19 [phone]

## 2019-06-17 IMAGING — CR DG CHEST 2V
1 series · 2 of 2 positions shown · non-contrast
Comparison: None.

CLINICAL DATA: Left shoulder pain radiating to the left arm and
left side of neck.

EXAM:
CHEST - 2 VIEW

[Series 1: dg chest 2 view · 0.14mm/px · 2 of 2 slices shown]
[im 1/2]
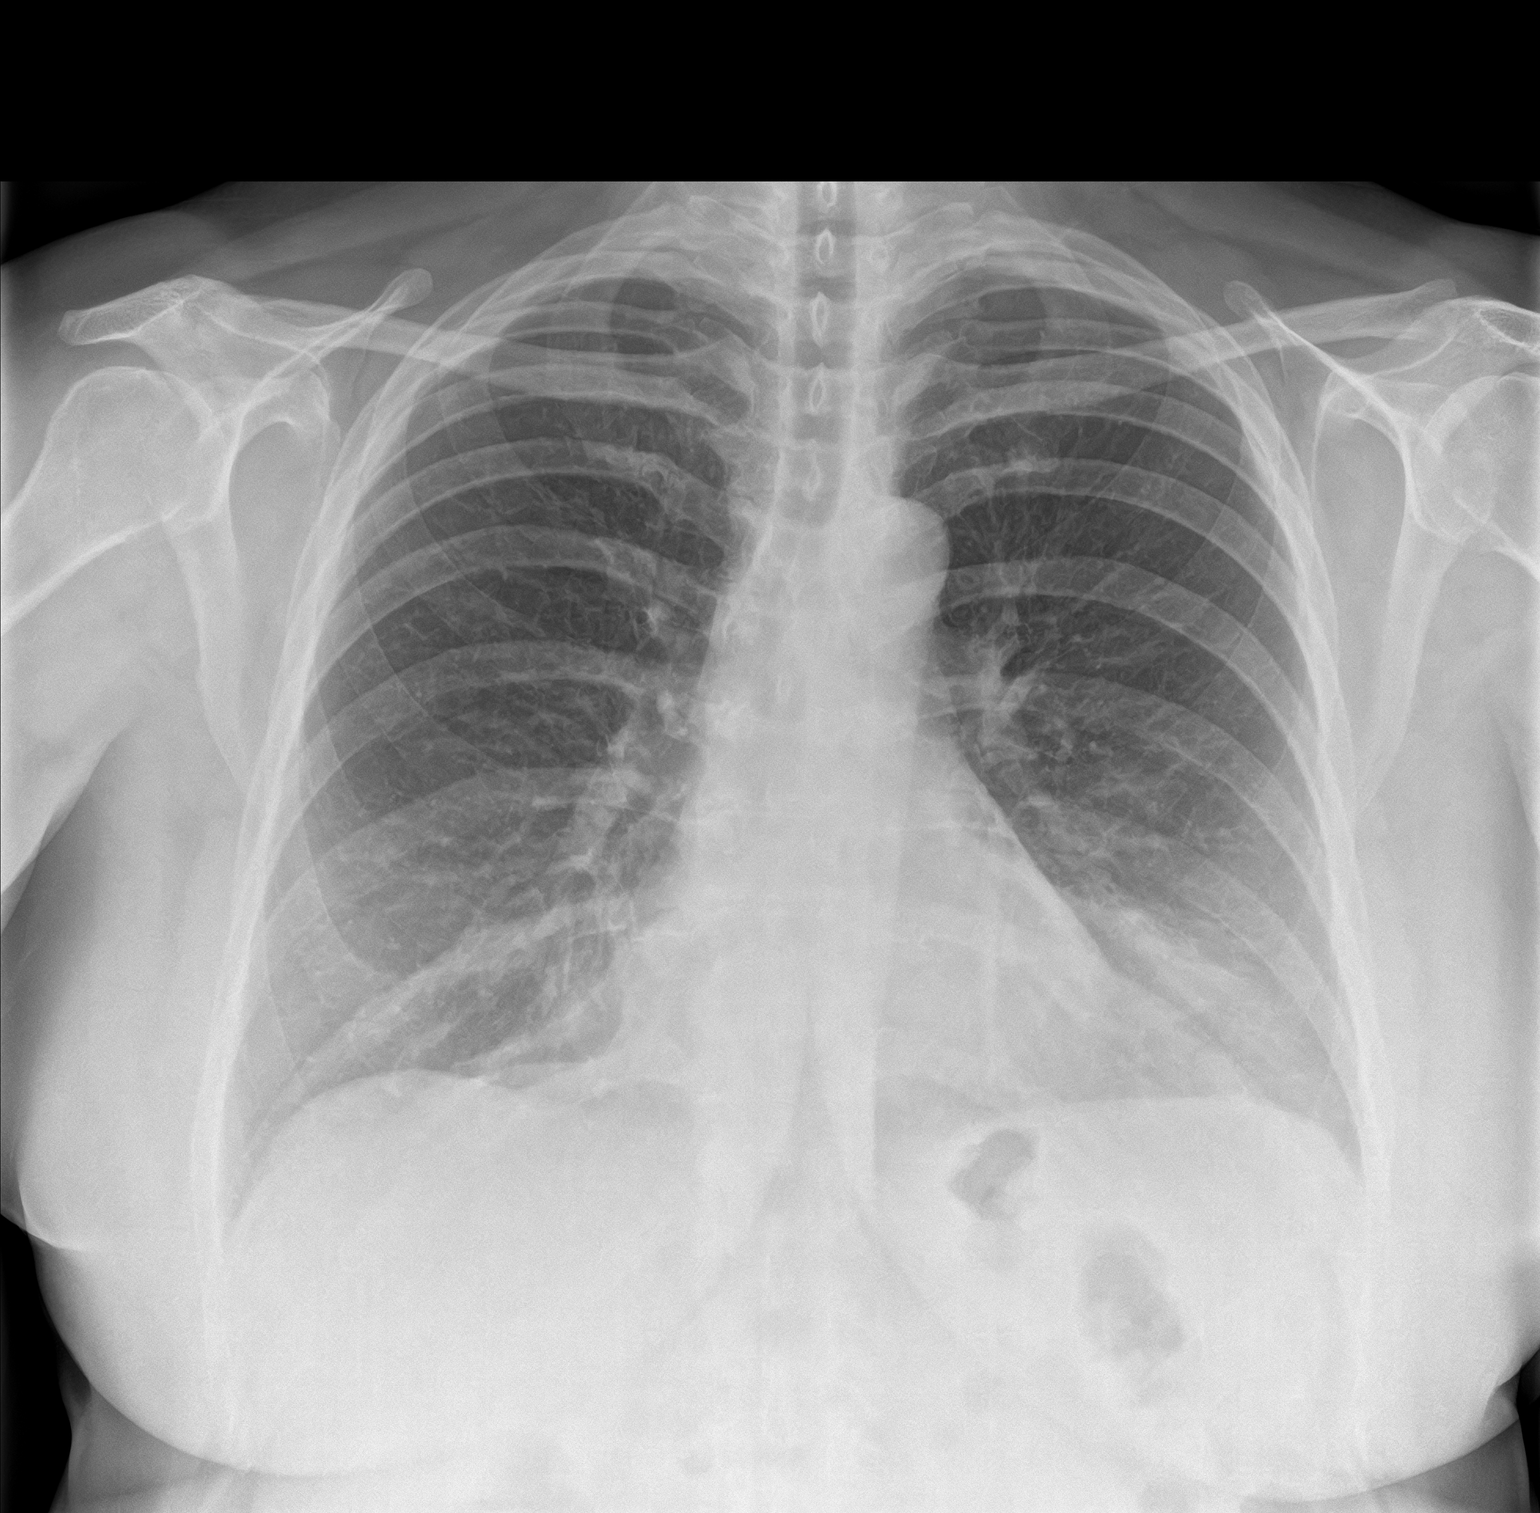
[im 2/2]
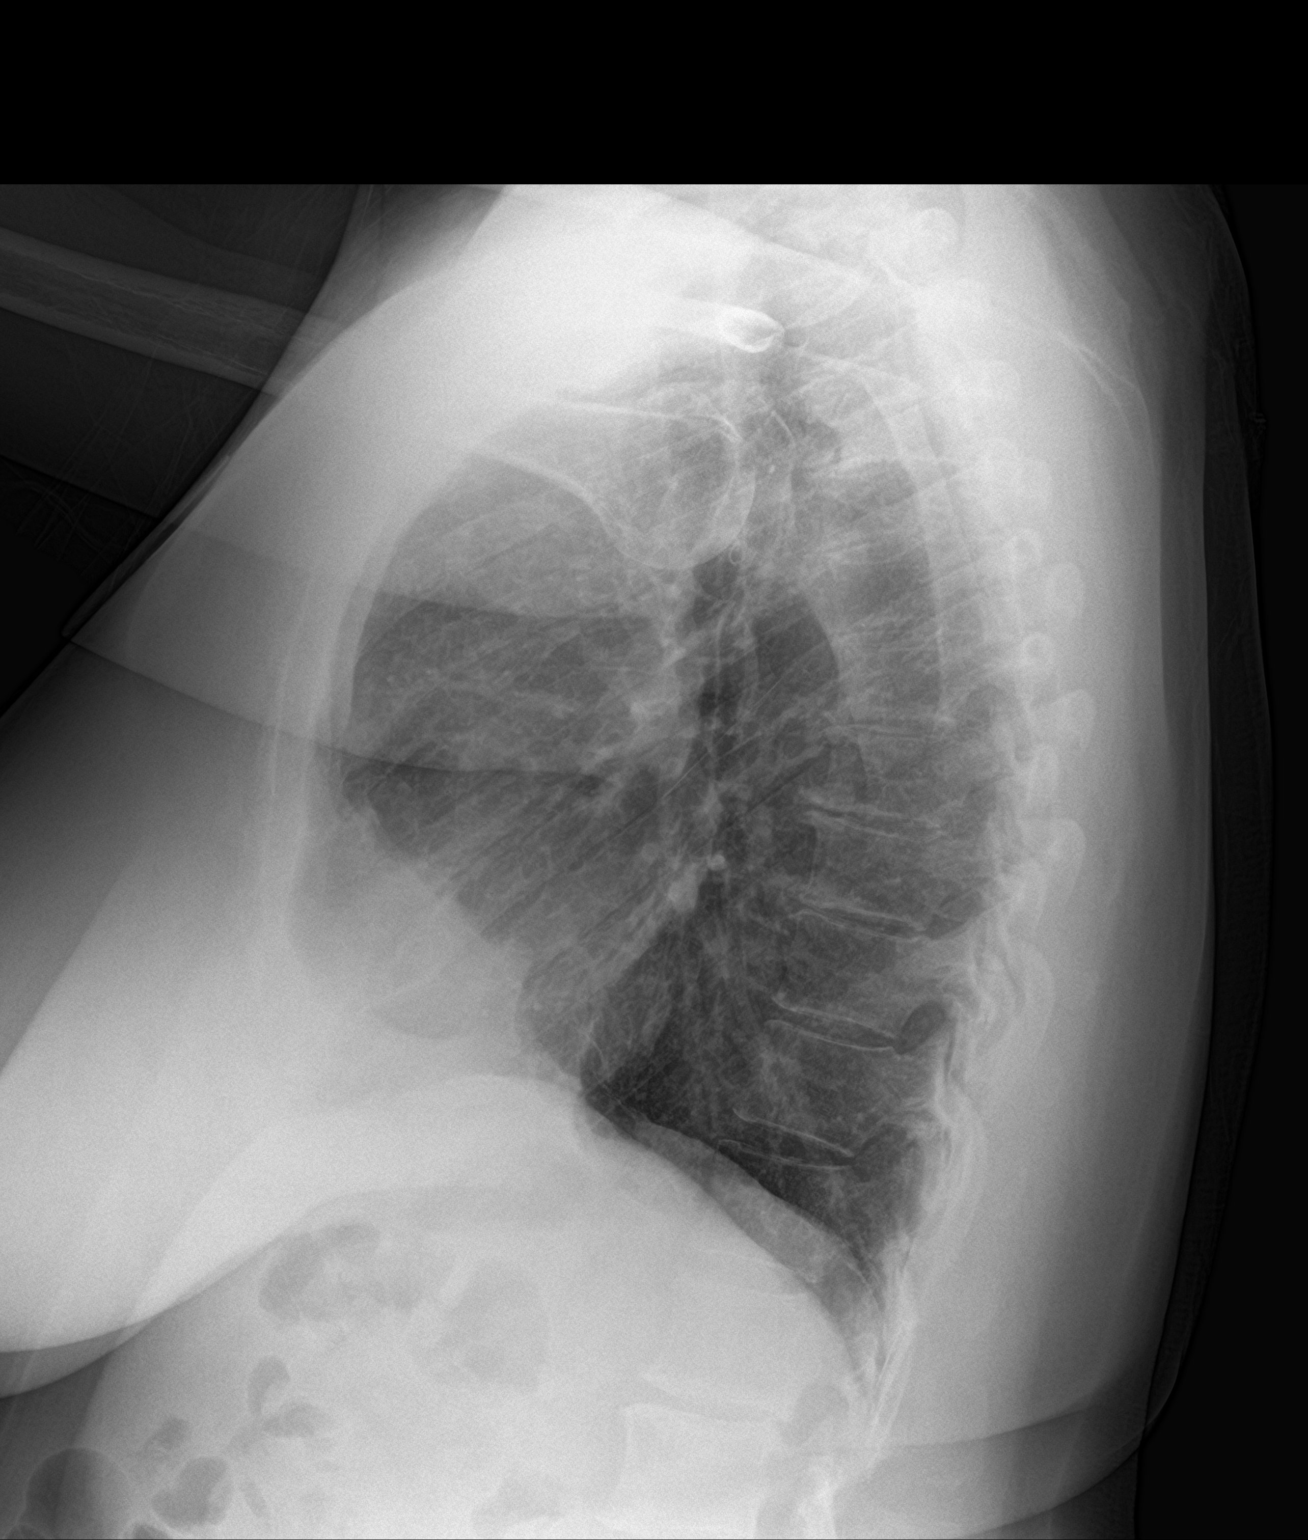

[2 of 2 positions shown; findings below may reference images not displayed]

FINDINGS: Heart size is normal. Nonaneurysmal thoracic aorta. Confluent
airspace disease at the left lung base suspicious for pneumonia in
the lingular distribution. No effusion or pneumothorax. No acute
osseous abnormality. Mild degenerative change along the thoracic
spine.
IMPRESSION: Lingular airspace opacity suspicious for pneumonia.

## 2019-07-08 ENCOUNTER — Other Ambulatory Visit: Payer: Self-pay | Admitting: Obstetrics and Gynecology

## 2019-07-08 DIAGNOSIS — N6320 Unspecified lump in the left breast, unspecified quadrant: Secondary | ICD-10-CM

## 2019-07-19 ENCOUNTER — Ambulatory Visit
Admission: RE | Admit: 2019-07-19 | Discharge: 2019-07-19 | Disposition: A | Discharge status: Home / Self Care | Payer: 59 | Source: Ambulatory Visit | Attending: Obstetrics and Gynecology | Admitting: Obstetrics and Gynecology

## 2019-07-19 DIAGNOSIS — N6320 Unspecified lump in the left breast, unspecified quadrant: Secondary | ICD-10-CM | POA: Diagnosis not present

## 2020-01-09 ENCOUNTER — Encounter: Payer: Self-pay | Admitting: Emergency Medicine

## 2020-01-09 ENCOUNTER — Other Ambulatory Visit: Payer: Self-pay

## 2020-01-09 ENCOUNTER — Ambulatory Visit
Admission: EM | Admit: 2020-01-09 | Discharge: 2020-01-09 | Disposition: A | Discharge status: Home / Self Care | Payer: 59 | Attending: Student | Admitting: Student

## 2020-01-09 DIAGNOSIS — M5442 Lumbago with sciatica, left side: Secondary | ICD-10-CM

## 2020-01-09 MED ORDER — CYCLOBENZAPRINE HCL 10 MG PO TABS: 10.0000 mg | ORAL_TABLET | Freq: Three times a day (TID) | ORAL | 0 refills | Status: DC

## 2020-01-09 MED ORDER — METHYLPREDNISOLONE 4 MG PO TBPK: ORAL_TABLET | ORAL | 0 refills | Status: DC

## 2020-01-09 NOTE — ED Triage Notes (Signed)
Patient c/o lower back that started a week ago.  Patient denies injury or fall.

## 2020-01-09 NOTE — Discharge Instructions (Addendum)
Take medications as directed

## 2020-01-09 NOTE — ED Provider Notes (Signed)
[facility]    CSN: [id number] Arrival date & time: 01/09/20  1328      History   Chief Complaint Chief Complaint  Patient presents with  . Back Pain    HPI Alexa Garcia is a 54 y.o. female who presents today for evaluation of ongoing low back discomfort.  The patient has been experiencing pain over the past week, she works at [employer], she states that while she was working the other day she began to experience a significant discomfort primarily in the lower left aspect of her back with occasional sharp radiation down to the left leg.  She has increased pain with walking, standing in addition to sitting.  She states over the past 48 hours the pain is worsening and she feels that her back is "locking up".  She denies any numbness or tingling to bilateral feet, she denies any trauma or injury affecting her lower back.  She denies any bowel or bladder dysfunction.  No blood in her urine.  She does report a history of low back pain in the past.  States that typically she will experience pain for 24-48 hours prior to the pain resolving.  No fevers or chills.  No weakness to bilateral lower extremities.  HPI  Past Medical History:  Diagnosis Date  . Anxiety   . Arthritis    BACK  . Complication of anesthesia    MASK MADE PT VERY ANXIOUS AND SHE FELT [place]  . GERD (gastroesophageal reflux disease)    H/O  . Headache    H/O migranes  . Hyperlipidemia   . PONV (postoperative nausea and vomiting)     Patient Active Problem List   Diagnosis Date Noted  . Postoperative state 10/27/2017    Past Surgical History:  Procedure Laterality Date  . ABDOMINAL HYSTERECTOMY    . ANTERIOR AND POSTERIOR REPAIR N/A 10/27/2017   Procedure: ANTERIOR (CYSTOCELE) AND POSTERIOR REPAIR (RECTOCELE);  Surgeon: Schermerhorn, Gwen Her, MD;  Location: [facility] ORS;  Service: Gynecology;  Laterality: N/A;  . APPENDECTOMY    . BUNIONECTOMY    . CARPAL TUNNEL RELEASE Left 10/08/2016     Procedure: CARPAL TUNNEL RELEASE;  Surgeon: Hessie Knows, MD;  Location: [facility] ORS;  Service: Orthopedics;  Laterality: Left;  . CARPAL TUNNEL RELEASE Right 07/24/2017   Procedure: CARPAL TUNNEL RELEASE;  Surgeon: Hessie Knows, MD;  Location: [facility] ORS;  Service: Orthopedics;  Laterality: Right;  . COLONOSCOPY N/A 02/14/2015   Procedure: COLONOSCOPY;  Surgeon: Lollie Sails, MD;  Location: [facility] ENDOSCOPY;  Service: Endoscopy;  Laterality: N/A;  . HAMMER TOE SURGERY    . HARDWARE REMOVAL Left 10/08/2016   Procedure: HARDWARE REMOVAL;  Surgeon: Hessie Knows, MD;  Location: [facility] ORS;  Service: Orthopedics;  Laterality: Left;  . OPEN REDUCTION INTERNAL FIXATION (ORIF) DISTAL RADIAL FRACTURE Left 04/04/2016   Procedure: OPEN REDUCTION INTERNAL FIXATION (ORIF) DISTAL RADIAL FRACTURE,  CLOSED REDUCTION PINNING OF DISTAL RADIAL ULNAR JOINT;  Surgeon: Hessie Knows, MD;  Location: [facility] ORS;  Service: Orthopedics;  Laterality: Left;    OB History   No obstetric history on file.      Home Medications    Prior to Admission medications   Medication Sig Start Date End Date Taking? Authorizing Provider  cetirizine (ZYRTEC) 10 MG tablet Take 10 mg by mouth daily as needed for allergies.   Yes [provider]  fluticasone (FLONASE) 50 MCG/ACT nasal spray Place 1-2 sprays into both nostrils daily as needed (for  allergies.).    Yes [provider]  Multiple Vitamin (MULTIVITAMIN WITH MINERALS) TABS tablet Take 3 tablets by mouth daily.    Yes [provider]  benzonatate (TESSALON) 200 MG capsule Take 1 capsule (200 mg total) by mouth 3 (three) times daily as needed for cough. 03/13/19   Coral Spikes, DO  bethanechol (URECHOLINE) 5 MG tablet Take 1 tablet (5 mg total) by mouth 3 (three) times daily. 10/28/17   Schermerhorn, Gwen Her, MD  cyclobenzaprine (FLEXERIL) 10 MG tablet Take 1 tablet (10 mg total) by mouth 3 (three) times daily. 01/09/20   Lattie Corns, PA-C   methylPREDNISolone (MEDROL DOSEPAK) 4 MG TBPK tablet Take per package instructions 01/09/20   Lattie Corns, PA-C  ondansetron [facility]) 8 MG tablet Take 1 tablet (8 mg total) by mouth every 8 (eight) hours as needed for nausea or vomiting. 10/28/17   Schermerhorn, Gwen Her, MD  predniSONE (DELTASONE) 50 MG tablet 1 tablet daily x 5 days 03/13/19   Coral Spikes, DO    Family History Family History  Problem Relation Age of Onset  . Cancer Mother   . Heart attack Father   . Breast cancer Neg Hx     Social History Social History   Tobacco Use  . Smoking status: Former Smoker    Packs/day: 1.00    Years: 15.00    Pack years: 15.00    Types: Cigarettes    Quit date: 04/01/2001    Years since quitting: 18.7  . Smokeless tobacco: Never Used  Vaping Use  . Vaping Use: Never used  Substance Use Topics  . Alcohol use: Yes    Comment: SOCIAL  . Drug use: No     Allergies   Other   Review of Systems Review of Systems  Musculoskeletal: Positive for back pain and myalgias.  All other systems reviewed and are negative.  Physical Exam Triage Vital Signs ED Triage Vitals  Enc Vitals Group     BP 01/09/20 1339 128/64     Pulse Rate 01/09/20 1339 92     Resp 01/09/20 1339 16     Temp 01/09/20 1339 98.2 F (36.8 C)     Temp Source 01/09/20 1339 Oral     SpO2 01/09/20 1339 99 %     Weight 01/09/20 1337 200 lb (90.7 kg)     Height 01/09/20 1337 5\' 4"  (1.626 m)     Head Circumference --      Peak Flow --      Pain Score 01/09/20 1336 8     Pain Loc --      Pain Edu? --      Excl. in [place]? --    No data found.  Updated Vital Signs BP 128/64 (BP Location: Right Arm)   Pulse 92   Temp 98.2 F (36.8 C) (Oral)   Resp 16   Ht 5\' 4"  (1.626 m)   Wt 200 lb (90.7 kg)   SpO2 99%   BMI 34.33 kg/m   Visual Acuity Right Eye Distance:   Left Eye Distance:   Bilateral Distance:    Right Eye Near:   Left Eye Near:    Bilateral Near:     Physical Exam Lumbar  Spine: Examination of the lumbar spine reveals no bony abnormality, no edema, and no ecchymosis.  There is no step off.  The patient has significant pain with any attempted range of motion of the lumbar spine, pain is located along  the left lower aspect of the back.  She is nontender palpation over the spinous process, moderate tenderness with palpation to the paravertebral musculature of the lumbar spine.  Moderate tenderness with palpation to the left sciatic notch.  The patient is tender along the Sacroiliac joint.  There is no Coccyx joint tenderness.    Bilateral Lower Extremities: Examination of the lower extremities reveals no bony abnormality, no edema, and no ecchymosis.  No pain with passive hip flexion and extension of her left hip.  There is no discomfort with range of motion exercises.  The patient is non tender along the greater trochanter region.  The patient has a negative Bevelyn Buckles' test bilaterally.  There is normal skin warmth.  There is normal capillary refill bilaterally.    Neurologic: The patient has a negative straight leg raise.  The patient has normal muscle strength testing for the quadriceps, calves, ankle dorsiflexion, ankle plantarflexion, and extensor hallicus longus.  The patient has sensation that is intact to light touch.  The deep tendon reflexes are nor  UC Treatments / Results  Labs (all labs ordered are listed, but only abnormal results are displayed) Labs Reviewed - No data to display  EKG   Radiology No results found.  Procedures Procedures (including critical care time)  Medications Ordered in UC Medications - No data to display  Initial Impression / Assessment and Plan / UC Course  I have reviewed the triage vital signs and the nursing notes.  Pertinent labs & imaging results that were available during my care of the patient were reviewed by me and considered in my medical decision making (see chart for details).     1.  Treatment options were  discussed today with the patient.  No evidence of lumbar myelopathy or cauda equina syndrome at today's appointment. 2.  The patient was offered but declined a Toradol injection to be given at today's appointment. 3.  The patient was given a Medrol Dosepak in addition to a prescription of Flexeril to take for her ongoing low back pain. 4.  We will follow-up on as-needed basis if symptoms continue.  Did discuss follow-up with her orthopedic provider if continued discomfort. Final Clinical Impressions(s) / UC Diagnoses   Final diagnoses:  Acute left-sided low back pain with left-sided sciatica     Discharge Instructions     Take medications as directed.    ED Prescriptions    Medication Sig Dispense Auth. Provider   methylPREDNISolone (MEDROL DOSEPAK) 4 MG TBPK tablet Take per package instructions 21 tablet Lattie Corns, PA-C   cyclobenzaprine (FLEXERIL) 10 MG tablet Take 1 tablet (10 mg total) by mouth 3 (three) times daily. 20 tablet Lattie Corns, PA-C     PDMP not reviewed this encounter.   Lattie Corns, [place] 01/09/20 [phone]

## 2020-10-03 ENCOUNTER — Other Ambulatory Visit: Payer: Self-pay

## 2020-10-03 ENCOUNTER — Ambulatory Visit
Admission: EM | Admit: 2020-10-03 | Discharge: 2020-10-03 | Disposition: A | Discharge status: Home / Self Care | Payer: 59

## 2020-10-03 DIAGNOSIS — J302 Other seasonal allergic rhinitis: Secondary | ICD-10-CM

## 2020-10-03 MED ORDER — IPRATROPIUM BROMIDE 0.06 % NA SOLN: 2.0000 | Freq: Four times a day (QID) | NASAL | 12 refills | Status: DC

## 2020-10-03 NOTE — ED Triage Notes (Signed)
Pt c/o seasonal allergies/nasal congestion. Pt states for the last 2-3 weeks she has had consistent nasal congestion, nothing she has done has cleared this. Pt has tried OTC decongestants and nasal spray, she also takes allergy medication. Pt denies fevers or other s/s.

## 2020-10-03 NOTE — Discharge Instructions (Addendum)
Take OTC Allegra 180 mg daily for your allergy symptoms.  Use your Flonase at bedtime. 2 sprays in each nostril at bedtime.  Take these medications daily and consistently.  Atrovent nasal spray, 2 sprays in each nostril every 6 hours, as needed for nasal congestion.  Continue to use your sinus rinse daily.  Keep your scheduled ENT appointment as scheduled.

## 2020-10-03 NOTE — ED Provider Notes (Signed)
[facility]    CSN: [id number] Arrival date & time: 10/03/20  0846      History   Chief Complaint Chief Complaint  Patient presents with   Nasal Congestion    HPI Alexa Garcia is a 55 y.o. female.   HPI  55 year old female here for evaluation of nasal congestion.  Patient reports that she has nasal allergies and she has been experiencing nasal congestion for the last 2 to 3 weeks.  She states that she will take Zyrtec or Flonase for couple of days, her symptoms will improve, she will stop and her symptoms will return.  She has not been using either 1 of these consistently.  She is also tried other over-the-counter decongestants without resolution of her symptoms.  Additionally, she is using sinus irrigation to help relieve her nasal congestion which does help intermittently.  She is also complaining of bilateral ear itching.  She denies fever, sore throat, or cough.  Past Medical History:  Diagnosis Date   Anxiety    Arthritis    BACK   Complication of anesthesia    MASK MADE PT VERY ANXIOUS AND SHE FELT LIKE SHE COULDNT BREATHE   GERD (gastroesophageal reflux disease)    H/O   Headache    H/O migranes   Hyperlipidemia    PONV (postoperative nausea and vomiting)     Patient Active Problem List   Diagnosis Date Noted   Postoperative state 10/27/2017    Past Surgical History:  Procedure Laterality Date   ABDOMINAL HYSTERECTOMY     ANTERIOR AND POSTERIOR REPAIR N/A 10/27/2017   Procedure: ANTERIOR (CYSTOCELE) AND POSTERIOR REPAIR (RECTOCELE);  Surgeon: Schermerhorn, Gwen Her, MD;  Location: [facility] ORS;  Service: Gynecology;  Laterality: N/A;   APPENDECTOMY     BUNIONECTOMY     CARPAL TUNNEL RELEASE Left 10/08/2016   Procedure: CARPAL TUNNEL RELEASE;  Surgeon: Hessie Knows, MD;  Location: [facility] ORS;  Service: Orthopedics;  Laterality: Left;   CARPAL TUNNEL RELEASE Right 07/24/2017   Procedure: CARPAL TUNNEL RELEASE;  Surgeon: Hessie Knows, MD;  Location:  [facility] ORS;  Service: Orthopedics;  Laterality: Right;   COLONOSCOPY N/A 02/14/2015   Procedure: COLONOSCOPY;  Surgeon: Lollie Sails, MD;  Location: [facility] ENDOSCOPY;  Service: Endoscopy;  Laterality: N/A;   HAMMER TOE SURGERY     HARDWARE REMOVAL Left 10/08/2016   Procedure: HARDWARE REMOVAL;  Surgeon: Hessie Knows, MD;  Location: [facility] ORS;  Service: Orthopedics;  Laterality: Left;   OPEN REDUCTION INTERNAL FIXATION (ORIF) DISTAL RADIAL FRACTURE Left 04/04/2016   Procedure: OPEN REDUCTION INTERNAL FIXATION (ORIF) DISTAL RADIAL FRACTURE,  CLOSED REDUCTION PINNING OF DISTAL RADIAL ULNAR JOINT;  Surgeon: Hessie Knows, MD;  Location: [facility] ORS;  Service: Orthopedics;  Laterality: Left;    OB History   No obstetric history on file.      Home Medications    Prior to Admission medications   Medication Sig Start Date End Date Taking? Authorizing Provider  cetirizine (ZYRTEC) 10 MG tablet Take 1 tablet by mouth daily as needed.   Yes [provider]  fluticasone (FLONASE) 50 MCG/ACT nasal spray Place 1-2 sprays into both nostrils daily as needed (for allergies.).    Yes [provider]  ipratropium (ATROVENT) 0.06 % nasal spray Place 2 sprays into both nostrils 4 (four) times daily. 10/03/20  Yes Margarette Canada, NP  Multiple Vitamin (MULTIVITAMIN WITH MINERALS) TABS tablet Take 3 tablets by mouth daily.    Yes [provider]  cyclobenzaprine (FLEXERIL)  10 MG tablet Take 1 tablet (10 mg total) by mouth 3 (three) times daily. 01/09/20   Lattie Corns, PA-C  ondansetron (ZOFRAN) 8 MG tablet Take 1 tablet (8 mg total) by mouth every 8 (eight) hours as needed for nausea or vomiting. 10/28/17   Schermerhorn, Gwen Her, MD    Family History Family History  Problem Relation Age of Onset   Cancer Mother    Heart attack Father    Breast cancer Neg Hx     Social History Social History   Tobacco Use   Smoking status: Former    Packs/day: 1.00    Years: 15.00    Pack  years: 15.00    Types: Cigarettes    Quit date: 04/01/2001    Years since quitting: 19.5   Smokeless tobacco: Never  Vaping Use   Vaping Use: Never used  Substance Use Topics   Alcohol use: Yes    Comment: SOCIAL   Drug use: No     Allergies   Other   Review of Systems Review of Systems  Constitutional:  Negative for activity change, appetite change and fever.  HENT:  Positive for congestion and rhinorrhea. Negative for ear discharge, ear pain and sore throat.   Respiratory:  Negative for cough.     Physical Exam Triage Vital Signs ED Triage Vitals  Enc Vitals Group     BP 10/03/20 1000 108/79     Pulse Rate 10/03/20 1000 69     Resp 10/03/20 1000 18     Temp 10/03/20 1000 97.7 F (36.5 C)     Temp Source 10/03/20 1000 Oral     SpO2 10/03/20 1000 100 %     Weight 10/03/20 0956 213 lb (96.6 kg)     Height 10/03/20 0956 '5\' 4"'$  (1.626 m)     Head Circumference --      Peak Flow --      Pain Score 10/03/20 0955 0     Pain Loc --      Pain Edu? --      Excl. in [place]? --    No data found.  Updated Vital Signs BP 108/79 (BP Location: Left Arm)   Pulse 69   Temp 97.7 F (36.5 C) (Oral)   Resp 18   Ht '5\' 4"'$  (1.626 m)   Wt 213 lb (96.6 kg)   SpO2 100%   BMI 36.56 kg/m   Visual Acuity Right Eye Distance:   Left Eye Distance:   Bilateral Distance:    Right Eye Near:   Left Eye Near:    Bilateral Near:     Physical Exam Vitals reviewed.  Constitutional:      General: She is not in acute distress.    Appearance: Normal appearance. She is obese. She is not ill-appearing.  HENT:     Head: Normocephalic and atraumatic.     Right Ear: Tympanic membrane, ear canal and external ear normal. There is no impacted cerumen.     Left Ear: Tympanic membrane, ear canal and external ear normal. There is no impacted cerumen.     Nose: Congestion and rhinorrhea present.     Mouth/Throat:     Mouth: Mucous membranes are moist.     Pharynx: Oropharynx is clear. No posterior  oropharyngeal erythema.  Cardiovascular:     Rate and Rhythm: Normal rate and regular rhythm.     Pulses: Normal pulses.     Heart sounds: Normal heart sounds. No murmur heard.  No gallop.  Pulmonary:     Effort: Pulmonary effort is normal.     Breath sounds: Normal breath sounds. No wheezing, rhonchi or rales.  Musculoskeletal:     Cervical back: Normal range of motion and neck supple.  Lymphadenopathy:     Cervical: No cervical adenopathy.  Skin:    General: Skin is warm and dry.     Capillary Refill: Capillary refill takes less than 2 seconds.     Findings: No erythema or rash.  Neurological:     General: No focal deficit present.     Mental Status: She is alert and oriented to person, place, and time.  Psychiatric:        Mood and Affect: Mood normal.        Behavior: Behavior normal.        Thought Content: Thought content normal.        Judgment: Judgment normal.     UC Treatments / Results  Labs (all labs ordered are listed, but only abnormal results are displayed) Labs Reviewed - No data to display  EKG   Radiology No results found.  Procedures Procedures (including critical care time)  Medications Ordered in UC Medications - No data to display  Initial Impression / Assessment and Plan / UC Course  I have reviewed the triage vital signs and the nursing notes.  Pertinent labs & imaging results that were available during my care of the patient were reviewed by me and considered in my medical decision making (see chart for details).  Patient is a pleasant, nontoxic-appearing 55 year old female here for evaluation of nasal congestion as outlined in the HPI above.  Patient's physical exam reveals pearly gray tympanic membranes bilaterally with a normal light reflex and clear external auditory canals.  There is some dried cerumen in both canals but it is not obscuring.  Nasal mucosa is pale and edematous with a slight bluish hue.  There is not significant clear  nasal discharge present.  Oropharyngeal exam is benign.  No cervical lymphadenopathy appreciated exam.  Cardiopulmonary exam reveals clear lung sounds in all fields.  Patient's exam is consistent with allergic rhinitis.  I have encouraged the patient to start taking her Flonase and Zyrtec daily and regularly to help with her allergy symptoms as she gets periodic relief when she takes it and her symptoms return when she stops.  I have also advised that if the Zyrtec is not improving her symptoms after a week she should try Allegra 180 mg daily.  She needs to continue her sinus irrigation to help wash away pollen particles, mold spores, and other environmental allergens which might be triggering her congestion.  I have also prescribed Atrovent nasal spray to help her with the nasal congestion by shrinking her nasal tissues.  Patient directed to return for new or worsening symptoms.   Final Clinical Impressions(s) / UC Diagnoses   Final diagnoses:  Seasonal allergic rhinitis, unspecified trigger     Discharge Instructions      Take OTC Allegra 180 mg daily for your allergy symptoms.  Use your Flonase at bedtime. 2 sprays in each nostril at bedtime.  Take these medications daily and consistently.  Atrovent nasal spray, 2 sprays in each nostril every 6 hours, as needed for nasal congestion.  Continue to use your sinus rinse daily.  Keep your scheduled ENT appointment as scheduled.     ED Prescriptions     Medication Sig Dispense Auth. Provider   ipratropium (ATROVENT) 0.06 %  nasal spray Place 2 sprays into both nostrils 4 (four) times daily. 15 mL Margarette Canada, NP      PDMP not reviewed this encounter.   Margarette Canada, NP 10/03/20 1020

## 2021-01-12 ENCOUNTER — Other Ambulatory Visit: Payer: Self-pay | Admitting: Obstetrics and Gynecology

## 2021-01-12 DIAGNOSIS — Z1231 Encounter for screening mammogram for malignant neoplasm of breast: Secondary | ICD-10-CM

## 2021-03-15 ENCOUNTER — Ambulatory Visit
Admission: RE | Admit: 2021-03-15 | Discharge: 2021-03-15 | Disposition: A | Discharge status: Home / Self Care | Payer: 59 | Source: Ambulatory Visit | Attending: Obstetrics and Gynecology | Admitting: Obstetrics and Gynecology

## 2021-03-15 ENCOUNTER — Other Ambulatory Visit: Payer: Self-pay

## 2021-03-15 DIAGNOSIS — Z1231 Encounter for screening mammogram for malignant neoplasm of breast: Secondary | ICD-10-CM | POA: Diagnosis not present

## 2022-09-11 IMAGING — MG MM DIGITAL SCREENING BILAT W/ TOMO AND CAD
6 of 10 series · 6 of 30 positions shown · non-contrast
Comparison: Previous exam(s).

CLINICAL DATA: Screening.

EXAM:
DIGITAL SCREENING BILATERAL MAMMOGRAM WITH TOMOSYNTHESIS AND CAD
TECHNIQUE: Bilateral screening digital craniocaudal and mediolateral oblique
mammograms were obtained. Bilateral screening digital breast
tomosynthesis was performed. The images were evaluated with
computer-aided detection.

[R CC synth-2D]
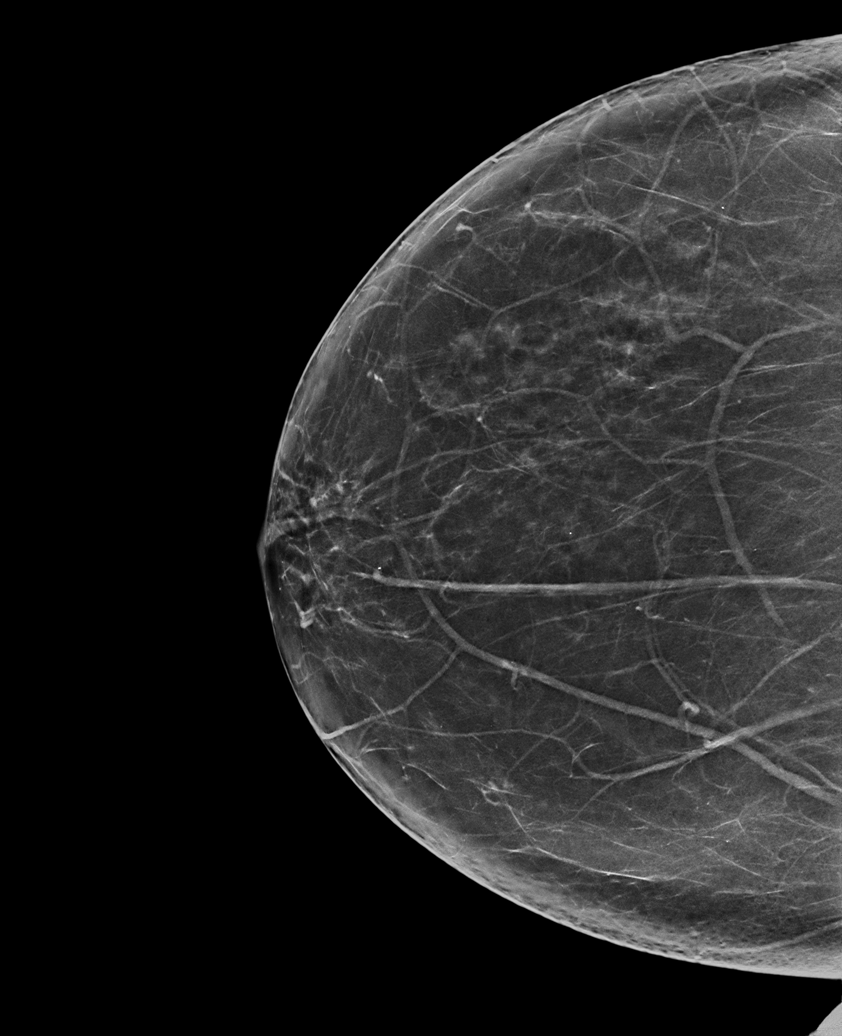

[L MLO synth-2D (1 of 2)]
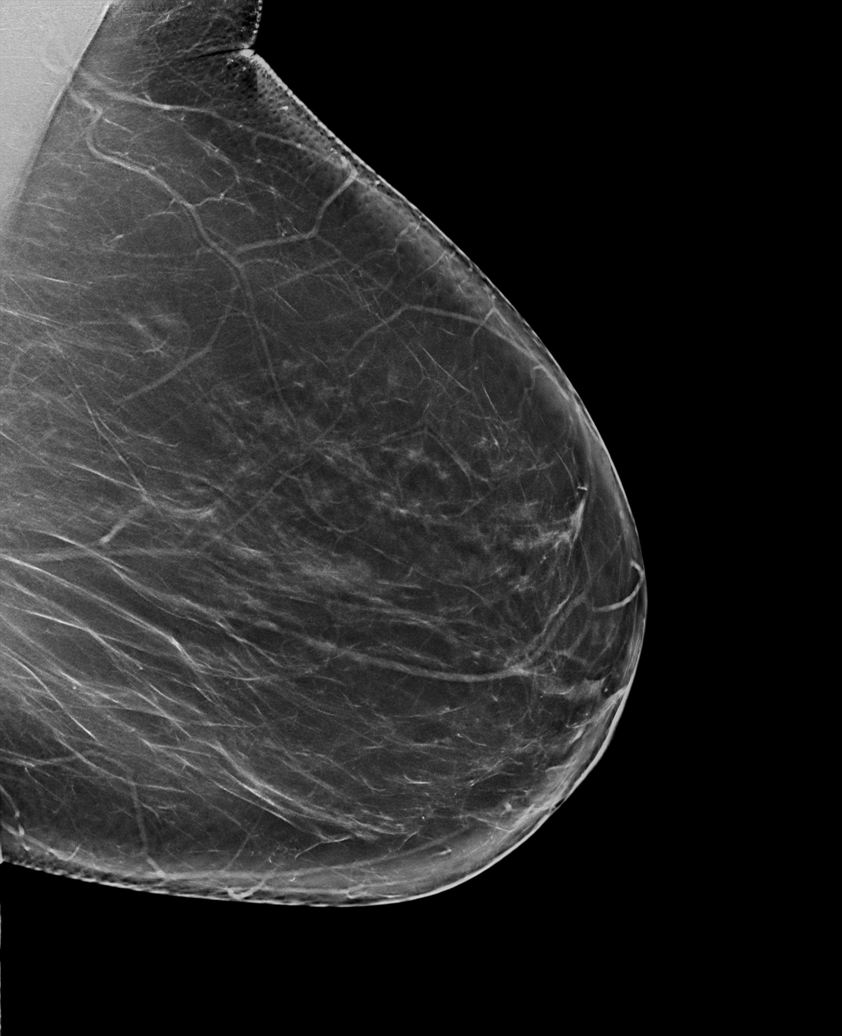

[R MLO synth-2D]
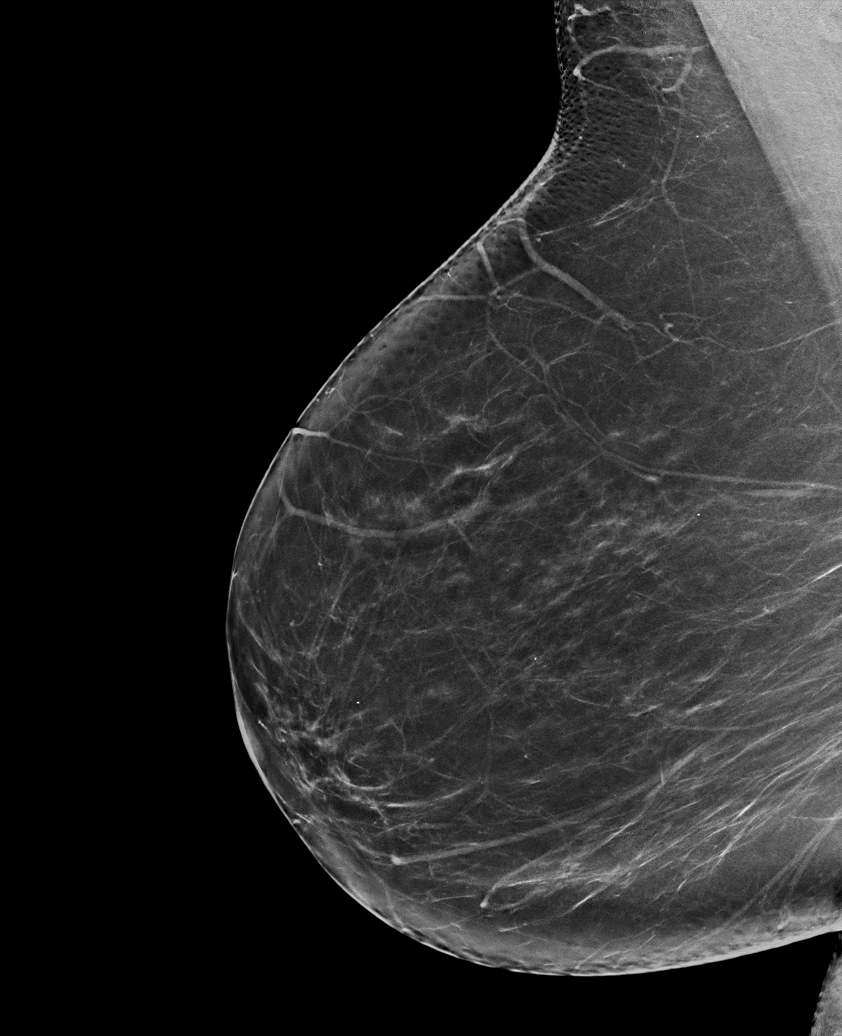

[L MLO synth-2D (2 of 2)]
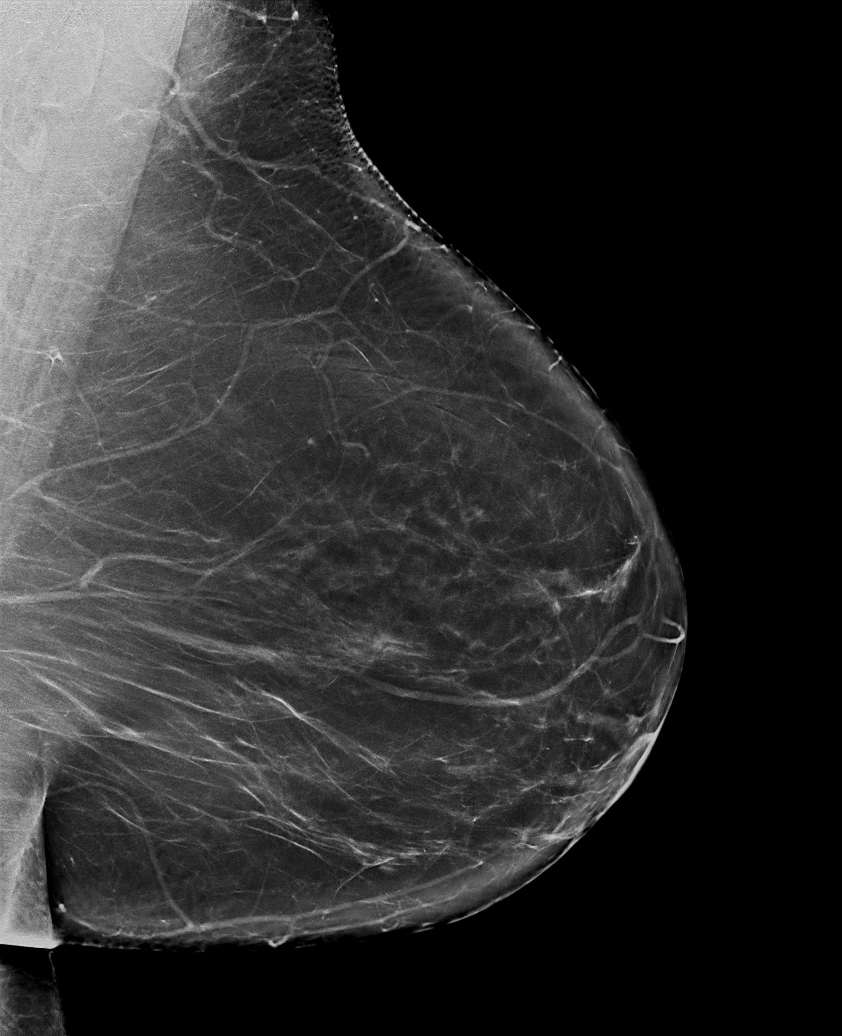

[L CC synth-2D]
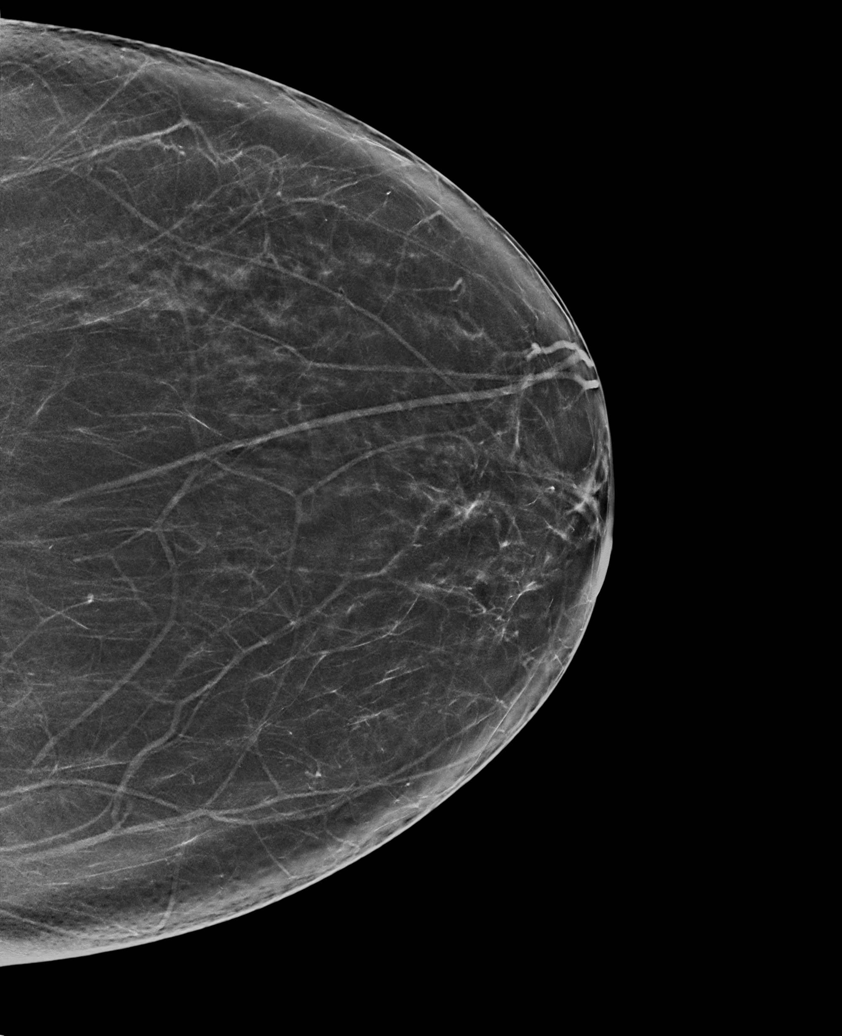

[R CC tomo · tomo slice 39/77.0]
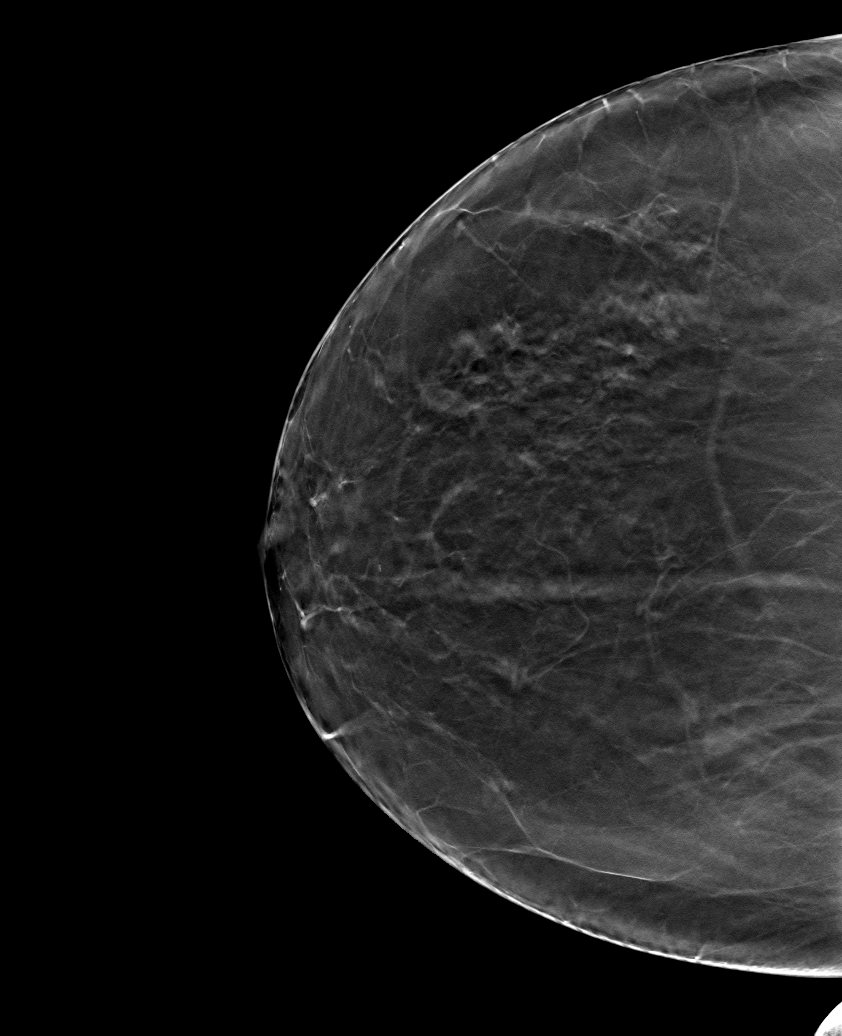

[6 of 30 positions shown; findings below may reference images not displayed]

ACR Breast Density Category b: There are scattered areas of
fibroglandular density.
FINDINGS: There are no findings suspicious for malignancy.
IMPRESSION: No mammographic evidence of malignancy. A result letter of this
screening mammogram will be mailed directly to the patient.

RECOMMENDATION:
Screening mammogram in one year. (Code:51-O-LD2)

BI-RADS CATEGORY  1: Negative.

## 2022-09-13 ENCOUNTER — Encounter: Payer: Self-pay | Admitting: Emergency Medicine

## 2022-09-13 ENCOUNTER — Other Ambulatory Visit: Payer: Self-pay

## 2022-09-13 ENCOUNTER — Emergency Department
Admission: EM | Admit: 2022-09-13 | Discharge: 2022-09-13 | Disposition: A | Discharge status: Home / Self Care | Payer: 59 | Attending: Emergency Medicine | Admitting: Emergency Medicine

## 2022-09-13 DIAGNOSIS — R300 Dysuria: Secondary | ICD-10-CM | POA: Diagnosis present

## 2022-09-13 DIAGNOSIS — R301 Vesical tenesmus: Secondary | ICD-10-CM | POA: Insufficient documentation

## 2022-09-13 DIAGNOSIS — N3289 Other specified disorders of bladder: Secondary | ICD-10-CM

## 2022-09-13 LAB — URINALYSIS, ROUTINE W REFLEX MICROSCOPIC
Bilirubin Urine: NEGATIVE
Glucose, UA: NEGATIVE mg/dL
Hgb urine dipstick: NEGATIVE
Ketones, ur: NEGATIVE mg/dL
Leukocytes,Ua: NEGATIVE
Nitrite: NEGATIVE
Protein, ur: NEGATIVE mg/dL
Specific Gravity, Urine: 1.006 (ref 1.005–1.030)
pH: 5 (ref 5.0–8.0)

## 2022-09-13 NOTE — ED Provider Notes (Signed)
   [facility] Provider Note    Event Date/Time   First MD Initiated Contact with Patient 09/13/22 1212     (approximate)   History   Dysuria   HPI  Alexa Garcia is a 57 y.o. female who presents with complaints of spasms in her bladder over the last 2 weeks.  She reports that the symptoms are similar to when she had bladder polyps diagnosed and removed 10 years ago.  She denies burning with urination does not think this is a urinary tract infection     Physical Exam   Triage Vital Signs: ED Triage Vitals  Enc Vitals Group     BP 09/13/22 1019 136/79     Pulse Rate 09/13/22 1019 77     Resp 09/13/22 1019 16     Temp 09/13/22 1019 97.9 F (36.6 C)     Temp Source 09/13/22 1019 Oral     SpO2 09/13/22 1019 97 %     Weight 09/13/22 1025 91.2 kg (201 lb)     Height 09/13/22 1025 1.626 m (5\' 4" )     Head Circumference --      Peak Flow --      Pain Score 09/13/22 1025 6     Pain Loc --      Pain Edu? --      Excl. in [place]? --     Most recent vital signs: Vitals:   09/13/22 1019  BP: 136/79  Pulse: 77  Resp: 16  Temp: 97.9 F (36.6 C)  SpO2: 97%     General: Awake, no distress.  CV:  Good peripheral perfusion.  Resp:  Normal effort.  Abd:  No distention.  Soft, nontender Other:     ED Results / Procedures / Treatments   Labs (all labs ordered are listed, but only abnormal results are displayed) Labs Reviewed  URINALYSIS, ROUTINE W REFLEX MICROSCOPIC - Abnormal; Notable for the following components:      Result Value   Color, Urine STRAW (*)    APPearance CLEAR (*)    All other components within normal limits     EKG     RADIOLOGY     PROCEDURES:  Critical Care performed:   Procedures   MEDICATIONS ORDERED IN ED: Medications - No data to display   IMPRESSION / MDM / ASSESSMENT AND PLAN / ED COURSE  I reviewed the triage vital signs and the nursing notes. Patient's presentation is most consistent with acute  illness / injury with system symptoms.  Patient presents with lower abdominal cramping as described above.    Her urinalysis is unremarkable  Suspect bladder spasms, unclear etiology, offered to prescribe medications for this or pain medications however she refused, she does not want start any medications until seeing urology.  Will place urology referral        FINAL CLINICAL IMPRESSION(S) / ED DIAGNOSES   Final diagnoses:  Bladder spasm     Rx / DC Orders   ED Discharge Orders          Ordered    Ambulatory referral to Urology        09/13/22 1228             Note:  This document was prepared using Dragon voice recognition software and may include unintentional dictation errors.   Jene Every, MD 09/13/22 [phone]

## 2022-09-13 NOTE — ED Triage Notes (Signed)
Pt states that she had bladder polyps removed 10 years ago. States that for the past 2 weeks she has had worsening pain after urination

## 2022-09-18 ENCOUNTER — Ambulatory Visit (INDEPENDENT_AMBULATORY_CARE_PROVIDER_SITE_OTHER): Payer: 59 | Admitting: Urology

## 2022-09-18 ENCOUNTER — Encounter: Payer: Self-pay | Admitting: Urology

## 2022-09-18 VITALS — BP 130/83 | HR 98 | Ht 64.0 in | Wt 200.8 lb

## 2022-09-18 DIAGNOSIS — N952 Postmenopausal atrophic vaginitis: Secondary | ICD-10-CM | POA: Diagnosis not present

## 2022-09-18 DIAGNOSIS — N3281 Overactive bladder: Secondary | ICD-10-CM | POA: Diagnosis not present

## 2022-09-18 DIAGNOSIS — R102 Pelvic and perineal pain: Secondary | ICD-10-CM | POA: Diagnosis not present

## 2022-09-18 DIAGNOSIS — Z87448 Personal history of other diseases of urinary system: Secondary | ICD-10-CM | POA: Diagnosis not present

## 2022-09-18 MED ORDER — ESTRADIOL 0.1 MG/GM VA CREA
TOPICAL_CREAM | VAGINAL | 12 refills | Status: DC
Start: 2022-09-18 — End: 2022-09-25

## 2022-09-18 NOTE — Patient Instructions (Signed)

## 2022-09-18 NOTE — Progress Notes (Signed)
B v

## 2022-09-18 NOTE — Progress Notes (Signed)
09/18/22 9:39 AM   Alexa Garcia [birth year] [id number]  CC: Pelvic pain, overactive bladder, reported history of bladder polyps  HPI: 57 year old female who reports at least 2 weeks of worsening urinary symptoms with occasional pain after voiding and urgency/frequency.  She was seen in the ER 09/13/2022 and dipstick urinalysis was benign and she was recommended to have urology follow-up.  She reportedly underwent removal of " bladder polyps" by a urologist about 10 years ago, none of those records are available to me.  She also has had a transvaginal hysterectomy and A/P repair with Dr. Feliberto Gottron, as well as a history of a reported vaginal nodule.  She was previously on Vesicare in the past through his office for OAB, but has not been on that recently.  She also has a history of lichen sclerosis and has been on topical steroid creams intermittently.  She denies any gross hematuria.  She does have some occasional left-sided flank or groin discomfort.  No recent imaging to review.  Primary complaint is urgency, frequency, occasional urge incontinence, occasional pelvic pain/bladder pain after voiding.  She feels the symptoms are similar to when she was diagnosed with bladder polyps previously.   PMH: Past Medical History:  Diagnosis Date   Anxiety    Arthritis    BACK   Complication of anesthesia    MASK MADE PT VERY ANXIOUS AND SHE FELT LIKE SHE COULDNT BREATHE   GERD (gastroesophageal reflux disease)    H/O   Headache    H/O migranes   Hyperlipidemia    PONV (postoperative nausea and vomiting)     Surgical History: Past Surgical History:  Procedure Laterality Date   ABDOMINAL HYSTERECTOMY     ANTERIOR AND POSTERIOR REPAIR N/A 10/27/2017   Procedure: ANTERIOR (CYSTOCELE) AND POSTERIOR REPAIR (RECTOCELE);  Surgeon: Schermerhorn, Ihor Austin, MD;  Location: [facility];  Service: Gynecology;  Laterality: N/A;   APPENDECTOMY     BUNIONECTOMY     CARPAL TUNNEL RELEASE Left 10/08/2016    Procedure: CARPAL TUNNEL RELEASE;  Surgeon: Kennedy Bucker, MD;  Location: [facility];  Service: Orthopedics;  Laterality: Left;   CARPAL TUNNEL RELEASE Right 07/24/2017   Procedure: CARPAL TUNNEL RELEASE;  Surgeon: Kennedy Bucker, MD;  Location: [facility];  Service: Orthopedics;  Laterality: Right;   COLONOSCOPY N/A 02/14/2015   Procedure: COLONOSCOPY;  Surgeon: Christena Deem, MD;  Location: [facility];  Service: Endoscopy;  Laterality: N/A;   HAMMER TOE SURGERY     HARDWARE REMOVAL Left 10/08/2016   Procedure: HARDWARE REMOVAL;  Surgeon: Kennedy Bucker, MD;  Location: [facility];  Service: Orthopedics;  Laterality: Left;   OPEN REDUCTION INTERNAL FIXATION (ORIF) DISTAL RADIAL FRACTURE Left 04/04/2016   Procedure: OPEN REDUCTION INTERNAL FIXATION (ORIF) DISTAL RADIAL FRACTURE,  CLOSED REDUCTION PINNING OF DISTAL RADIAL ULNAR JOINT;  Surgeon: Kennedy Bucker, MD;  Location: [facility];  Service: Orthopedics;  Laterality: Left;    Family History: Family History  Problem Relation Age of Onset   Cancer Mother    Heart attack Father    Breast cancer Neg Hx     Social History:  reports that she quit smoking about 21 years ago. Her smoking use included cigarettes. She has a 15.00 pack-year smoking history. She has never used smokeless tobacco. She reports current alcohol use. She reports that she does not use drugs.  Physical Exam: BP 130/83 (BP Location: Left Arm, Patient Position: Sitting, Cuff Size: Large)   Pulse 98   Ht 5\' 4"  (1.626 m)  Wt 200 lb 12.8 oz (91.1 kg)   BMI 34.47 kg/m    Constitutional:  Alert and oriented, No acute distress. Cardiovascular: No clubbing, cyanosis, or edema. Respiratory: Normal respiratory effort, no increased work of breathing. GI: Abdomen is soft, nontender, nondistended, no abdominal masses   Laboratory Data: Reviewed, see HPI  Pertinent Imaging: None to review  Assessment & Plan:   57 year old female with lichen sclerosis, history of transvaginal  hysterectomy and A/P repair with GYN, history of vaginal nodule followed by GYN, reported history of bladder polyps removed by urologist over 10 years ago(no records available to me), as well as overactive bladder not currently on any medications.  I recommended cystoscopy for further evaluation of her symptoms as well as reported history of bladder polyps.  Will try to obtain those outside records.  We also discussed consideration of a trial of medications for OAB, as it sounds like she has benefited from Vesicare in the past, but she deferred.  I also encouraged her to consider a topical estrogen cream which may improve her symptoms as well if this is more related to genitourinary syndrome menopause.  Trial of topical estrogen cream Follow-up for cystoscopy Consider CT to evaluate for stones/other pathology if cystoscopy benign  Legrand Rams, MD 09/18/2022  [facility] [address], [address] [place], [place] [address] [phone]

## 2022-09-24 ENCOUNTER — Ambulatory Visit: Payer: Self-pay | Admitting: Urology

## 2022-09-25 ENCOUNTER — Encounter: Payer: Self-pay | Admitting: Urology

## 2022-09-25 ENCOUNTER — Ambulatory Visit (INDEPENDENT_AMBULATORY_CARE_PROVIDER_SITE_OTHER): Payer: 59 | Admitting: Urology

## 2022-09-25 VITALS — BP 117/81 | HR 118 | Ht 64.0 in | Wt 200.0 lb

## 2022-09-25 DIAGNOSIS — N3281 Overactive bladder: Secondary | ICD-10-CM

## 2022-09-25 DIAGNOSIS — R102 Pelvic and perineal pain: Secondary | ICD-10-CM | POA: Diagnosis not present

## 2022-09-25 DIAGNOSIS — Z87448 Personal history of other diseases of urinary system: Secondary | ICD-10-CM

## 2022-09-25 MED ORDER — LIDOCAINE HCL URETHRAL/MUCOSAL 2 % EX GEL
1.0000 | Freq: Once | CUTANEOUS | Status: AC
Start: 2022-09-25 — End: 2022-09-25
  Administered 2022-09-25: 1 via URETHRAL

## 2022-09-25 MED ORDER — PREMARIN 0.625 MG/GM VA CREA
TOPICAL_CREAM | VAGINAL | 12 refills | Status: AC
Start: 2022-09-25 — End: ?

## 2022-09-25 NOTE — Progress Notes (Signed)
Cystoscopy Procedure Note:  Indication: Reported history of bladder polyps  57 year old female who reports a history of bladder polyps removed by urologist about 10 years ago, none of those records are available to me.  She also has a history of a transvaginal hysterectomy and A/P repair by Dr. Feliberto Gottron, and a history of a vaginal nodule.  She also has lichen sclerosis and has been on topical steroid creams intermittently.  Her primary complaint is pelvic pain, urgency/urge incontinence.  Urinalysis 7/5 was benign  After informed consent and discussion of the procedure and its risks, Alexa Garcia was positioned and prepped in the standard fashion. Cystoscopy was performed with a flexible cystoscope. The urethra, bladder neck and entire bladder was visualized in a standard fashion. The ureteral orifices were visualized in their normal location and orientation.  The bladder mucosa was grossly normal throughout, no abnormalities on retroflexion.  Normal-appearing urethra.  Findings: Normal cystoscopy  Assessment and Plan: Normal cystoscopy, no evidence of bladder polyps or other urologic issue.  I offered her CT for further evaluation of her pelvic pain, as well as a trial of Gemtesa for her overactive symptoms but she deferred.  She is a challenging historian, and she would like to avoid medications.  She would like to see Dr. Feliberto Gottron to see if he has any other options, or if he would consider a pelvic ultrasound for further evaluation of previously noted vaginal nodule  Legrand Rams, MD 09/25/2022

## 2022-09-25 NOTE — Patient Instructions (Signed)

## 2023-12-25 ENCOUNTER — Ambulatory Visit
Admission: EM | Admit: 2023-12-25 | Discharge: 2023-12-25 | Disposition: A | Discharge status: Home / Self Care | Attending: Emergency Medicine | Admitting: Emergency Medicine

## 2023-12-25 DIAGNOSIS — J039 Acute tonsillitis, unspecified: Secondary | ICD-10-CM | POA: Diagnosis present

## 2023-12-25 LAB — GROUP A STREP BY PCR: Group A Strep by PCR: NOT DETECTED

## 2023-12-25 MED ORDER — PENICILLIN V POTASSIUM 500 MG PO TABS: 500.0000 mg | ORAL_TABLET | Freq: Two times a day (BID) | ORAL | 0 refills | Status: AC

## 2023-12-25 NOTE — ED Triage Notes (Signed)
 Pt being seen in UC for sore throat and L ear pain that began yesterday. Pt denies cough, SOB, CP, fever, and nasal congestion. Pt denies sick contacts. Pt reports significant pain L ear and throat.

## 2023-12-25 NOTE — Discharge Instructions (Addendum)
 You have exudative tonsillitis, rest, push fluids, follow up with PCP. May alternate tylenol /ibuprofen  as label directed for pain/fever. Take antibiotic as directed(veetid). Do not eat or drink after anyone, throw toothbrush away tomorrow, get new one.  If you are unable to keep your antibiotic down or have worsening symptoms, difficulty breathing, unable to handle your secretions, go to the ER for further evaluation

## 2023-12-25 NOTE — ED Provider Notes (Signed)
 [facility]    CSN: [id number] Arrival date & time: 12/25/23  9187      History   Chief Complaint Chief Complaint  Patient presents with   Sore Throat   Otalgia    HPI Alexa Garcia is a 58 y.o. female.   58 year old female, Alexa Garcia, presents to urgent care for evaluation of sore throat and left ear pain that began yesterday, hurts to swallow; patient states her daughter was sick last week.  No meds taken.  No drooling, patient is able to handle her secretions, airway patent  The history is provided by the patient. No language interpreter was used.    Past Medical History:  Diagnosis Date   Anxiety    Arthritis    BACK   Complication of anesthesia    MASK MADE PT VERY ANXIOUS AND SHE FELT LIKE SHE COULDNT BREATHE   GERD (gastroesophageal reflux disease)    H/O   Headache    H/O migranes   Hyperlipidemia    PONV (postoperative nausea and vomiting)     Patient Active Problem List   Diagnosis Date Noted   Exudative tonsillitis 12/25/2023   Postoperative state 10/27/2017    Past Surgical History:  Procedure Laterality Date   ABDOMINAL HYSTERECTOMY     ANTERIOR AND POSTERIOR REPAIR N/A 10/27/2017   Procedure: ANTERIOR (CYSTOCELE) AND POSTERIOR REPAIR (RECTOCELE);  Surgeon: Schermerhorn, Debby PARAS, MD;  Location: [facility];  Service: Gynecology;  Laterality: N/A;   APPENDECTOMY     BUNIONECTOMY     CARPAL TUNNEL RELEASE Left 10/08/2016   Procedure: CARPAL TUNNEL RELEASE;  Surgeon: Kathlynn Sharper, MD;  Location: [facility];  Service: Orthopedics;  Laterality: Left;   CARPAL TUNNEL RELEASE Right 07/24/2017   Procedure: CARPAL TUNNEL RELEASE;  Surgeon: Kathlynn Sharper, MD;  Location: [facility];  Service: Orthopedics;  Laterality: Right;   COLONOSCOPY N/A 02/14/2015   Procedure: COLONOSCOPY;  Surgeon: Gladis RAYMOND Mariner, MD;  Location: [facility] ENDOSCOPY;  Service: Endoscopy;  Laterality: N/A;   HAMMER TOE SURGERY     HARDWARE REMOVAL Left 10/08/2016    Procedure: HARDWARE REMOVAL;  Surgeon: Kathlynn Sharper, MD;  Location: [facility];  Service: Orthopedics;  Laterality: Left;   OPEN REDUCTION INTERNAL FIXATION (ORIF) DISTAL RADIAL FRACTURE Left 04/04/2016   Procedure: OPEN REDUCTION INTERNAL FIXATION (ORIF) DISTAL RADIAL FRACTURE,  CLOSED REDUCTION PINNING OF DISTAL RADIAL ULNAR JOINT;  Surgeon: Sharper Kathlynn, MD;  Location: [facility];  Service: Orthopedics;  Laterality: Left;    OB History   No obstetric history on file.      Home Medications    Prior to Admission medications   Medication Sig Start Date End Date Taking? Authorizing Provider  penicillin v potassium (VEETID) 500 MG tablet Take 1 tablet (500 mg total) by mouth every 12 (twelve) hours for 10 days. 12/25/23 01/04/24 Yes Denae Zulueta, NP  clobetasol cream (TEMOVATE) 0.05 % Apply 1 Application topically 2 (two) times daily.    [provider]  conjugated estrogens  (PREMARIN ) vaginal cream Estrogen Cream Instruction Discard applicator Apply pea sized amount to tip of finger to urethra before bed. Wash hands well after application. Use Monday, Wednesday and Friday Patient not taking: Reported on 09/25/2022 09/25/22   Francisca Redell BROCKS, MD  fluticasone (FLONASE) 50 MCG/ACT nasal spray Place 2 sprays into both nostrils daily.    [provider]    Family History Family History  Problem Relation Age of Onset   Cancer Mother    Heart attack Father  Breast cancer Neg Hx     Social History Social History   Tobacco Use   Smoking status: Former    Current packs/day: 0.00    Average packs/day: 1 pack/day for 15.0 years (15.0 ttl pk-yrs)    Types: Cigarettes    Start date: 04/01/1986    Quit date: 04/01/2001    Years since quitting: 22.7   Smokeless tobacco: Never  Vaping Use   Vaping status: Never Used  Substance Use Topics   Alcohol use: Yes    Comment: SOCIAL   Drug use: No     Allergies   Other   Review of Systems Review of Systems   Constitutional:  Negative for fever.  HENT:  Positive for ear pain and sore throat. Negative for ear discharge and facial swelling.   Respiratory:  Negative for cough.   All other systems reviewed and are negative.    Physical Exam Triage Vital Signs ED Triage Vitals  Encounter Vitals Group     BP 12/25/23 0834 112/76     Girls Systolic BP Percentile --      Girls Diastolic BP Percentile --      Boys Systolic BP Percentile --      Boys Diastolic BP Percentile --      Pulse Rate 12/25/23 0834 (!) 102     Resp 12/25/23 0834 18     Temp 12/25/23 0834 98.7 F (37.1 C)     Temp Source 12/25/23 0834 Oral     SpO2 12/25/23 0834 94 %     Weight --      Height --      Head Circumference --      Peak Flow --      Pain Score 12/25/23 0830 9     Pain Loc --      Pain Education --      Exclude from Growth Chart --    No data found.  Updated Vital Signs BP 112/76 (BP Location: Right Arm)   Pulse (!) 102   Temp 98.7 F (37.1 C) (Oral)   Resp 18   SpO2 94%   Visual Acuity Right Eye Distance:   Left Eye Distance:   Bilateral Distance:    Right Eye Near:   Left Eye Near:    Bilateral Near:     Physical Exam Vitals and nursing note reviewed.  Constitutional:      Appearance: She is well-developed and well-groomed.  HENT:     Head: Normocephalic.     Right Ear: Tympanic membrane normal.     Left Ear: Tympanic membrane normal.     Ears:     Comments: No mastoid tenderness or erythema bilaterally    Nose: Nose normal.     Mouth/Throat:     Lips: Pink.     Mouth: Mucous membranes are dry.     Pharynx: Uvula midline. Oropharyngeal exudate and posterior oropharyngeal erythema present. No uvula swelling.     Tonsils: Tonsillar exudate present. No tonsillar abscesses.  Cardiovascular:     Rate and Rhythm: Normal rate and regular rhythm.     Heart sounds: Normal heart sounds.  Pulmonary:     Effort: Pulmonary effort is normal.     Breath sounds: Normal breath sounds and  air entry.  Lymphadenopathy:     Cervical: Cervical adenopathy present.  Skin:    General: Skin is warm.     Capillary Refill: Capillary refill takes less than 2 seconds.  Neurological:  General: No focal deficit present.     Mental Status: She is alert and oriented to person, place, and time.     GCS: GCS eye subscore is 4. GCS verbal subscore is 5. GCS motor subscore is 6.  Psychiatric:        Attention and Perception: Attention normal.        Mood and Affect: Mood normal.        Speech: Speech normal.        Behavior: Behavior normal. Behavior is cooperative.      UC Treatments / Results  Labs (all labs ordered are listed, but only abnormal results are displayed) Labs Reviewed  GROUP A STREP BY PCR    EKG   Radiology No results found.  Procedures Procedures (including critical care time)  Medications Ordered in UC Medications - No data to display  Initial Impression / Assessment and Plan / UC Course  I have reviewed the triage vital signs and the nursing notes.  Pertinent labs & imaging results that were available during my care of the patient were reviewed by me and considered in my medical decision making (see chart for details).    Discussed exam findings and plan of care with patient, strep negative, treating for exudative tonsillitis with Veetid, strict go to ER precautions given.   Patient verbalized understanding to this provider.  Ddx: Exudative tonsillitis, otalgia, viral illness, allergies Final Clinical Impressions(s) / UC Diagnoses   Final diagnoses:  Exudative tonsillitis     Discharge Instructions      You have exudative tonsillitis, rest, push fluids, follow up with PCP. May alternate tylenol /ibuprofen  as label directed for pain/fever. Take antibiotic as directed(veetid). Do not eat or drink after anyone, throw toothbrush away tomorrow, get new one.  If you are unable to keep your antibiotic down or have worsening symptoms, difficulty  breathing, unable to handle your secretions, go to the ER for further evaluation     ED Prescriptions     Medication Sig Dispense Auth. Provider   penicillin v potassium (VEETID) 500 MG tablet Take 1 tablet (500 mg total) by mouth every 12 (twelve) hours for 10 days. 20 tablet Mohamud Mrozek, Rilla, NP      PDMP not reviewed this encounter.   Aminta Rilla, NP 12/25/23 1024
